# Patient Record
Sex: Male | Born: 1945 | Race: White | Hispanic: No | Marital: Married | Smoking: Never smoker
Health system: Southern US, Community
[De-identification: ages and names within clinical notes are randomized; demographics above are authoritative.]

## PROBLEM LIST (undated history)

## (undated) DIAGNOSIS — E119 Type 2 diabetes mellitus without complications: Secondary | ICD-10-CM

## (undated) DIAGNOSIS — I350 Nonrheumatic aortic (valve) stenosis: Secondary | ICD-10-CM

## (undated) DIAGNOSIS — I10 Essential (primary) hypertension: Secondary | ICD-10-CM

## (undated) HISTORY — PX: OTHER SURGICAL HISTORY: SHX169

## (undated) HISTORY — PX: AORTIC VALVE REPLACEMENT: SHX41

## (undated) HISTORY — PX: THORACIC AORTIC ANEURYSM REPAIR: SHX799

## (undated) HISTORY — DX: Type 2 diabetes mellitus without complications: E11.9

## (undated) HISTORY — DX: Essential (primary) hypertension: I10

## (undated) HISTORY — PX: ROTATOR CUFF REPAIR: SHX139

---
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
## 2000-03-28 ENCOUNTER — Ambulatory Visit (HOSPITAL_COMMUNITY): Admission: RE | Admit: 2000-03-28 | Discharge: 2000-03-28 | Payer: Self-pay | Admitting: *Deleted

## 2000-12-03 DIAGNOSIS — I350 Nonrheumatic aortic (valve) stenosis: Secondary | ICD-10-CM

## 2000-12-03 HISTORY — PX: THORACIC AORTIC ANEURYSM REPAIR: SHX799

## 2000-12-03 HISTORY — PX: AORTIC VALVE REPLACEMENT: SHX41

## 2000-12-03 HISTORY — DX: Nonrheumatic aortic (valve) stenosis: I35.0

## 2001-04-23 ENCOUNTER — Ambulatory Visit (HOSPITAL_COMMUNITY): Admission: RE | Admit: 2001-04-23 | Discharge: 2001-04-23 | Payer: Self-pay | Admitting: Cardiology

## 2001-10-08 ENCOUNTER — Ambulatory Visit (HOSPITAL_COMMUNITY): Admission: RE | Admit: 2001-10-08 | Discharge: 2001-10-08 | Payer: Self-pay | Admitting: *Deleted

## 2001-10-08 ENCOUNTER — Encounter: Payer: Self-pay | Admitting: *Deleted

## 2001-10-20 ENCOUNTER — Inpatient Hospital Stay (HOSPITAL_COMMUNITY): Admission: RE | Admit: 2001-10-20 | Discharge: 2001-10-25 | Payer: Self-pay | Admitting: Surgery

## 2001-10-20 ENCOUNTER — Encounter (INDEPENDENT_AMBULATORY_CARE_PROVIDER_SITE_OTHER): Payer: Self-pay | Admitting: *Deleted

## 2001-10-20 ENCOUNTER — Encounter: Payer: Self-pay | Admitting: Surgery

## 2001-10-21 ENCOUNTER — Encounter: Payer: Self-pay | Admitting: Surgery

## 2001-10-22 ENCOUNTER — Encounter: Payer: Self-pay | Admitting: Surgery

## 2001-11-12 ENCOUNTER — Encounter: Payer: Self-pay | Admitting: Cardiology

## 2001-11-12 ENCOUNTER — Ambulatory Visit (HOSPITAL_COMMUNITY): Admission: RE | Admit: 2001-11-12 | Discharge: 2001-11-12 | Payer: Self-pay | Admitting: Cardiology

## 2002-04-30 ENCOUNTER — Ambulatory Visit (HOSPITAL_COMMUNITY): Admission: RE | Admit: 2002-04-30 | Discharge: 2002-04-30 | Payer: Self-pay | Admitting: Cardiology

## 2004-05-12 ENCOUNTER — Encounter: Payer: Self-pay | Admitting: Cardiology

## 2004-05-12 ENCOUNTER — Ambulatory Visit (HOSPITAL_COMMUNITY): Admission: RE | Admit: 2004-05-12 | Discharge: 2004-05-12 | Payer: Self-pay | Admitting: *Deleted

## 2004-10-04 ENCOUNTER — Ambulatory Visit: Payer: Self-pay | Admitting: Cardiology

## 2004-10-20 ENCOUNTER — Ambulatory Visit: Payer: Self-pay | Admitting: Cardiology

## 2004-11-01 ENCOUNTER — Ambulatory Visit: Payer: Self-pay | Admitting: Cardiology

## 2004-11-29 ENCOUNTER — Ambulatory Visit: Payer: Self-pay | Admitting: Cardiology

## 2004-12-29 ENCOUNTER — Ambulatory Visit: Payer: Self-pay | Admitting: Cardiology

## 2005-02-01 ENCOUNTER — Ambulatory Visit: Payer: Self-pay | Admitting: Cardiology

## 2005-02-07 IMAGING — US US EXTREM LOW VENOUS*L*
1 series · 14 of 23 positions shown · non-contrast
Comparison: none

CLINICAL DATA: Left leg swelling and pain.
 LEFT LOWER EXTREMITY VENOUS IMAGING-DOPPLER
 Gray scale and Doppler ultrasound evaluation of the deep venous system was performed from the level of the common femoral vein to the popliteal vein. Complete venous compressibility is demonstrated. Duplex wave forms show normal directional venous flow, with respiratory phasicity and normal response to augmentation.

 IMPRESSION
 No evidence of deep venous thrombosis.

[Series 1: unknown · 14 of 23 slices shown]
[im 1/23]
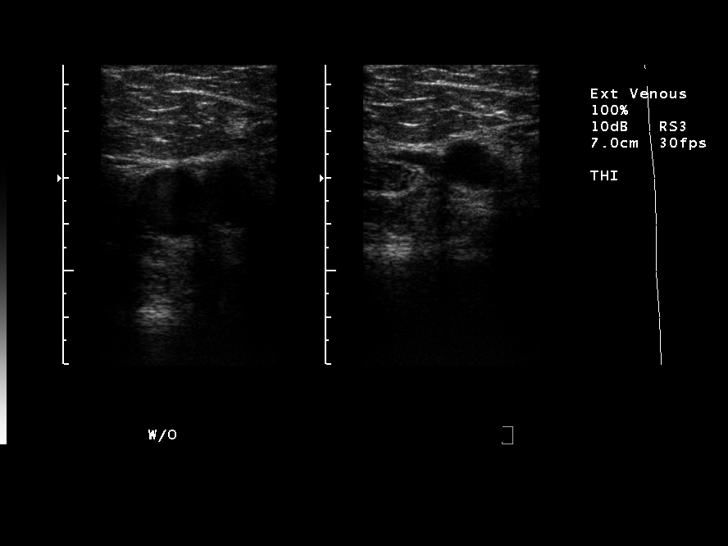
[im 3/23]
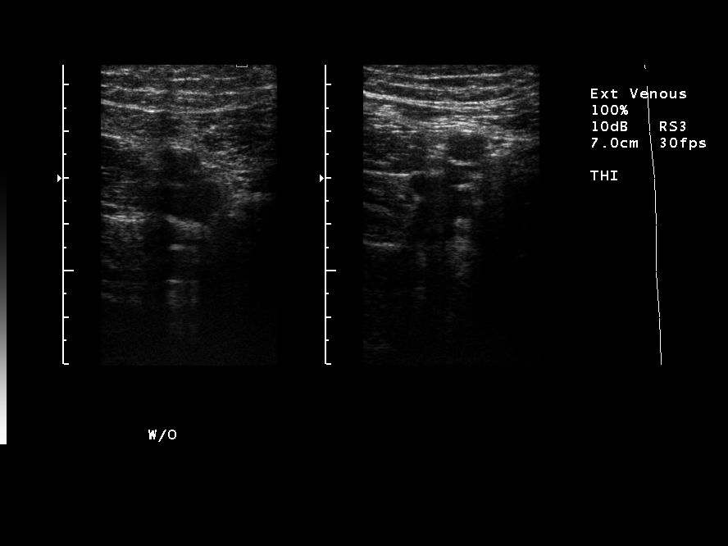
[im 5/23]
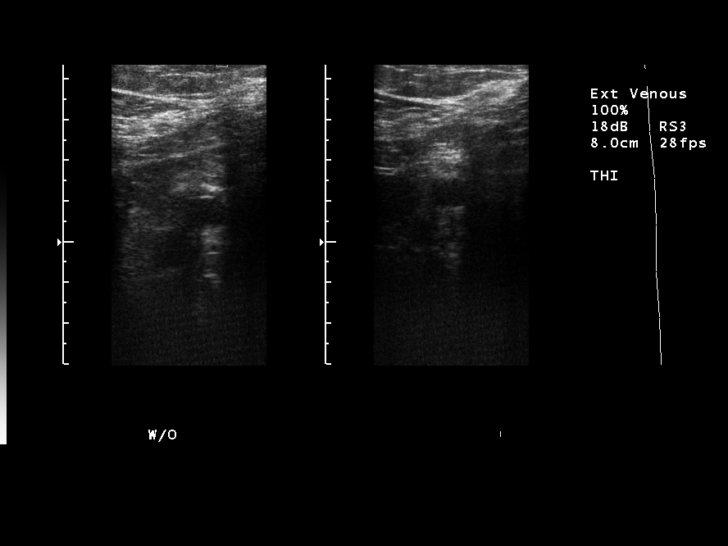
[im 6/23]
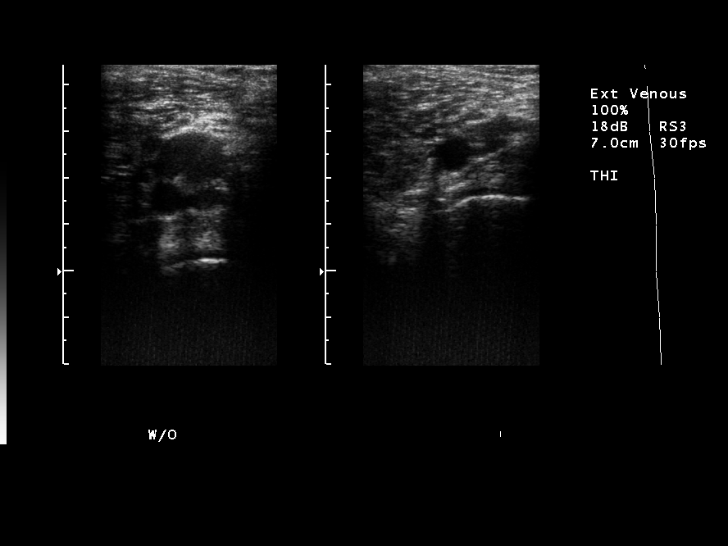
[im 8/23]
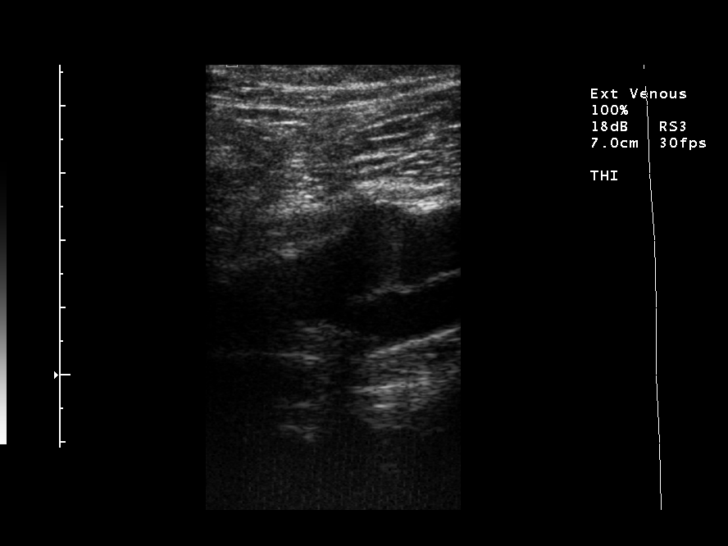
[im 10/23]
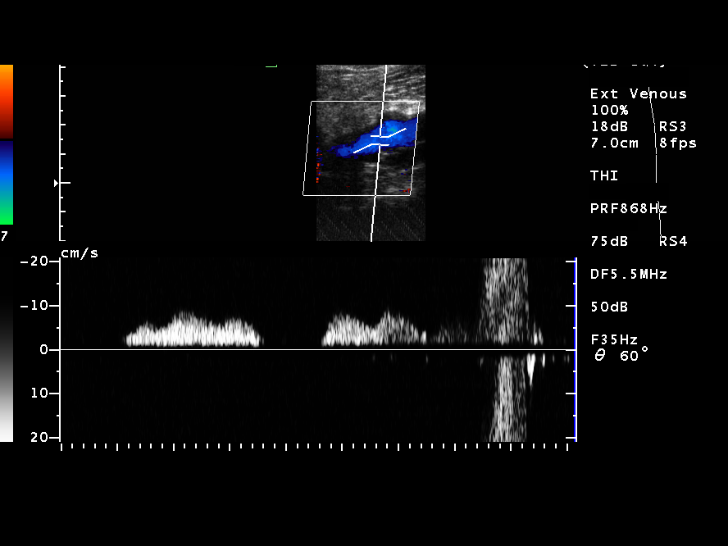
[im 11/23]
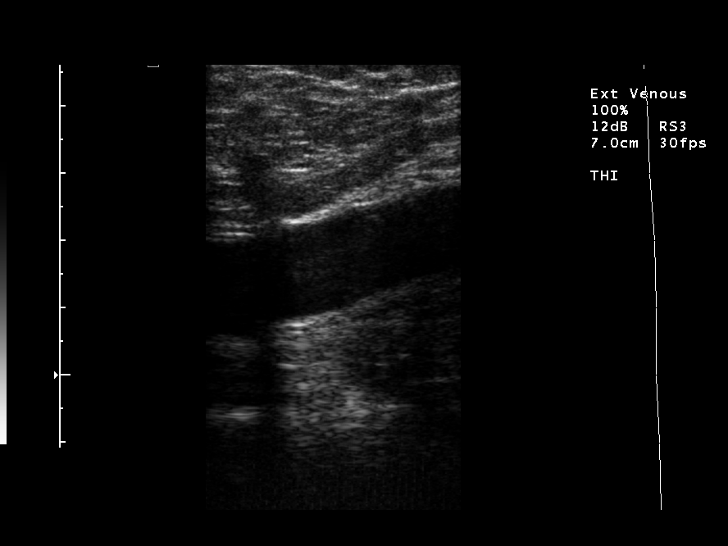
[im 13/23]
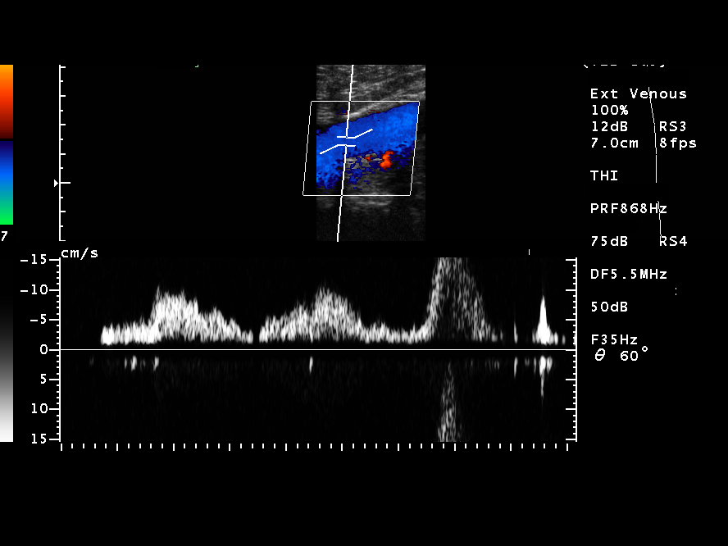
[im 14/23]
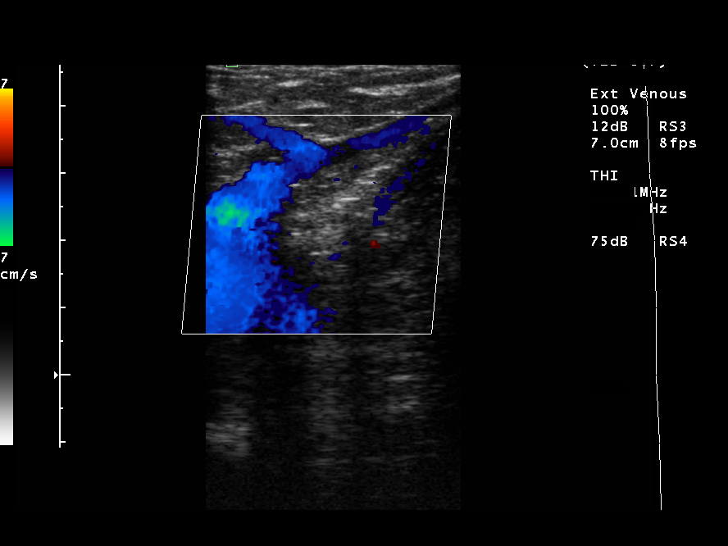
[im 16/23]
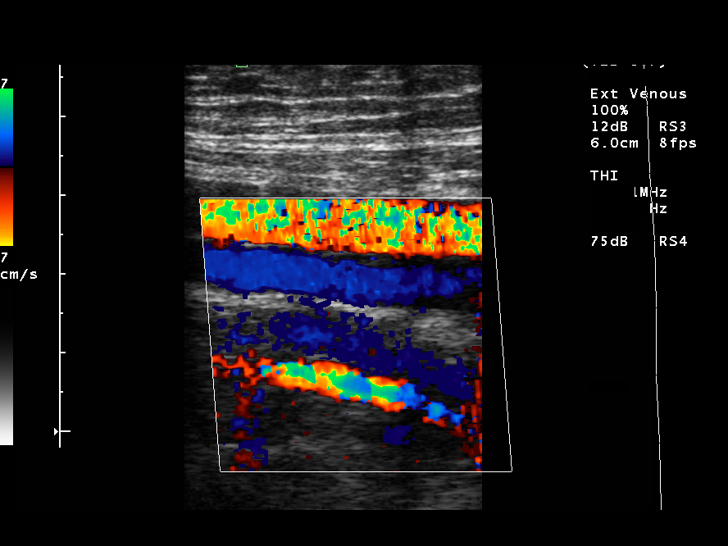
[im 18/23]
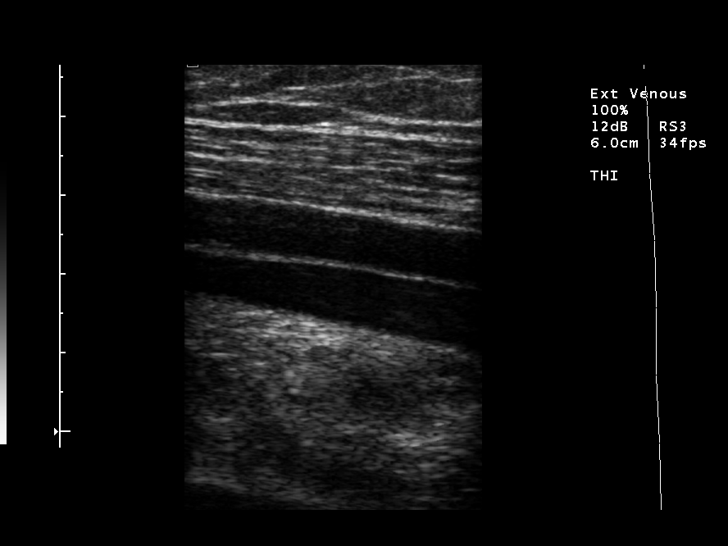
[im 19/23]
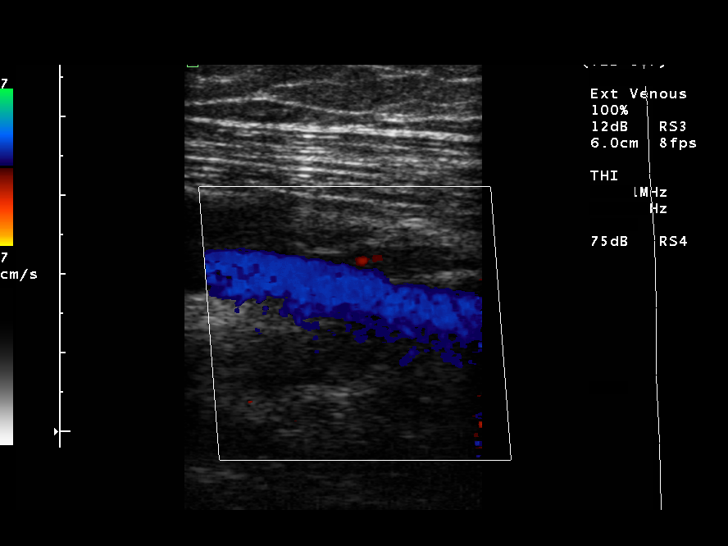
[im 21/23]
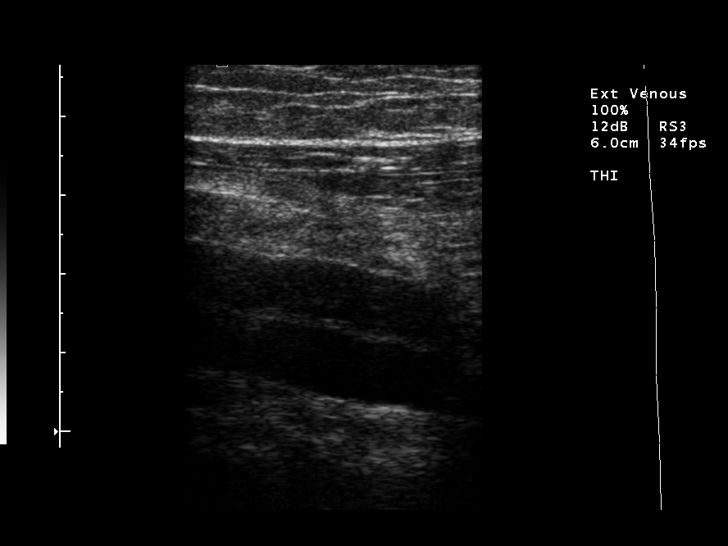
[im 23/23]
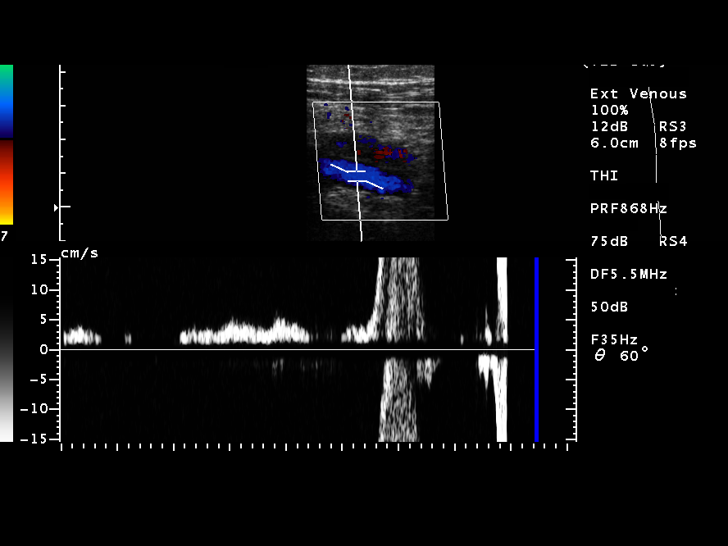

[14 of 23 positions shown; findings below may reference images not displayed]

## 2005-03-05 ENCOUNTER — Ambulatory Visit: Payer: Self-pay | Admitting: Cardiology

## 2005-03-13 ENCOUNTER — Ambulatory Visit: Payer: Self-pay | Admitting: Cardiology

## 2005-04-06 ENCOUNTER — Ambulatory Visit: Payer: Self-pay | Admitting: Cardiology

## 2005-04-13 ENCOUNTER — Ambulatory Visit: Payer: Self-pay | Admitting: Cardiology

## 2005-05-04 ENCOUNTER — Ambulatory Visit: Payer: Self-pay | Admitting: Cardiology

## 2005-05-17 ENCOUNTER — Ambulatory Visit: Payer: Self-pay | Admitting: *Deleted

## 2005-06-13 ENCOUNTER — Ambulatory Visit: Payer: Self-pay | Admitting: Cardiology

## 2005-07-11 ENCOUNTER — Ambulatory Visit: Payer: Self-pay | Admitting: Cardiology

## 2005-08-08 ENCOUNTER — Ambulatory Visit: Payer: Self-pay | Admitting: Cardiology

## 2005-09-05 ENCOUNTER — Ambulatory Visit: Payer: Self-pay | Admitting: Cardiology

## 2005-10-03 ENCOUNTER — Ambulatory Visit: Payer: Self-pay | Admitting: Cardiology

## 2005-11-08 ENCOUNTER — Ambulatory Visit: Payer: Self-pay | Admitting: Cardiology

## 2005-12-06 ENCOUNTER — Ambulatory Visit: Payer: Self-pay | Admitting: Cardiology

## 2006-01-07 ENCOUNTER — Ambulatory Visit: Payer: Self-pay | Admitting: Cardiology

## 2006-02-04 ENCOUNTER — Ambulatory Visit: Payer: Self-pay | Admitting: Cardiology

## 2006-02-25 ENCOUNTER — Ambulatory Visit: Payer: Self-pay | Admitting: Cardiology

## 2006-04-01 ENCOUNTER — Ambulatory Visit: Payer: Self-pay | Admitting: Cardiology

## 2006-04-15 ENCOUNTER — Ambulatory Visit: Payer: Self-pay | Admitting: Cardiology

## 2006-05-02 ENCOUNTER — Ambulatory Visit: Payer: Self-pay | Admitting: Cardiology

## 2006-05-17 ENCOUNTER — Ambulatory Visit: Payer: Self-pay | Admitting: Cardiology

## 2006-05-27 ENCOUNTER — Ambulatory Visit: Payer: Self-pay | Admitting: *Deleted

## 2006-06-20 ENCOUNTER — Ambulatory Visit: Payer: Self-pay | Admitting: Cardiology

## 2006-07-18 ENCOUNTER — Ambulatory Visit: Payer: Self-pay | Admitting: Cardiology

## 2006-09-23 ENCOUNTER — Ambulatory Visit: Payer: Self-pay | Admitting: Cardiology

## 2006-10-22 ENCOUNTER — Ambulatory Visit: Payer: Self-pay | Admitting: Cardiology

## 2006-11-27 ENCOUNTER — Ambulatory Visit: Payer: Self-pay | Admitting: Cardiology

## 2006-12-25 ENCOUNTER — Ambulatory Visit: Payer: Self-pay | Admitting: Cardiology

## 2007-01-03 ENCOUNTER — Ambulatory Visit: Payer: Self-pay | Admitting: Cardiology

## 2007-01-22 ENCOUNTER — Ambulatory Visit: Payer: Self-pay | Admitting: Cardiology

## 2007-02-19 ENCOUNTER — Ambulatory Visit: Payer: Self-pay | Admitting: Cardiology

## 2007-03-19 ENCOUNTER — Ambulatory Visit: Payer: Self-pay | Admitting: Cardiology

## 2007-04-24 ENCOUNTER — Ambulatory Visit: Payer: Self-pay | Admitting: Cardiology

## 2007-05-22 ENCOUNTER — Ambulatory Visit: Payer: Self-pay | Admitting: Cardiology

## 2007-06-23 ENCOUNTER — Ambulatory Visit: Payer: Self-pay | Admitting: Cardiology

## 2007-07-04 ENCOUNTER — Ambulatory Visit: Payer: Self-pay | Admitting: Cardiology

## 2007-07-16 ENCOUNTER — Encounter: Payer: Self-pay | Admitting: Cardiology

## 2007-07-16 ENCOUNTER — Ambulatory Visit: Payer: Self-pay | Admitting: Cardiology

## 2007-07-29 ENCOUNTER — Ambulatory Visit: Payer: Self-pay | Admitting: Cardiology

## 2007-09-09 ENCOUNTER — Ambulatory Visit: Payer: Self-pay | Admitting: Cardiology

## 2007-10-07 ENCOUNTER — Ambulatory Visit: Payer: Self-pay | Admitting: Cardiology

## 2007-11-06 ENCOUNTER — Ambulatory Visit: Payer: Self-pay | Admitting: Cardiology

## 2007-12-08 ENCOUNTER — Ambulatory Visit: Payer: Self-pay | Admitting: Cardiology

## 2007-12-22 ENCOUNTER — Ambulatory Visit: Payer: Self-pay | Admitting: Cardiology

## 2008-01-05 ENCOUNTER — Ambulatory Visit: Payer: Self-pay | Admitting: Cardiology

## 2008-02-09 ENCOUNTER — Ambulatory Visit: Payer: Self-pay | Admitting: Cardiology

## 2008-03-16 ENCOUNTER — Ambulatory Visit: Payer: Self-pay | Admitting: Cardiology

## 2008-04-13 ENCOUNTER — Ambulatory Visit: Payer: Self-pay | Admitting: Cardiology

## 2008-05-12 ENCOUNTER — Ambulatory Visit: Payer: Self-pay | Admitting: Cardiology

## 2008-09-10 ENCOUNTER — Ambulatory Visit: Payer: Self-pay | Admitting: Cardiology

## 2008-09-10 ENCOUNTER — Encounter: Payer: Self-pay | Admitting: Physician Assistant

## 2008-09-23 ENCOUNTER — Encounter: Payer: Self-pay | Admitting: Physician Assistant

## 2009-04-15 ENCOUNTER — Ambulatory Visit: Payer: Self-pay | Admitting: Cardiology

## 2009-05-27 ENCOUNTER — Ambulatory Visit (HOSPITAL_COMMUNITY): Admission: RE | Admit: 2009-05-27 | Discharge: 2009-05-28 | Payer: Self-pay | Admitting: Orthopedic Surgery

## 2009-08-11 DIAGNOSIS — E785 Hyperlipidemia, unspecified: Secondary | ICD-10-CM | POA: Insufficient documentation

## 2009-08-11 DIAGNOSIS — I10 Essential (primary) hypertension: Secondary | ICD-10-CM

## 2010-01-26 ENCOUNTER — Encounter: Payer: Self-pay | Admitting: Cardiology

## 2010-03-02 ENCOUNTER — Encounter: Payer: Self-pay | Admitting: Cardiology

## 2010-04-05 ENCOUNTER — Encounter: Payer: Self-pay | Admitting: Cardiology

## 2010-04-20 ENCOUNTER — Ambulatory Visit: Payer: Self-pay | Admitting: Cardiology

## 2010-04-20 DIAGNOSIS — Z954 Presence of other heart-valve replacement: Secondary | ICD-10-CM | POA: Insufficient documentation

## 2011-01-02 NOTE — Assessment & Plan Note (Signed)
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  The patient had minimal coronary artery disease.  He has a normal ejection fraction.  He has a history of hypertension but this has been controlled.  The patient is compliant with Coumadin therapy.  He denies any shortness of breath orthopnea PND has no palpitations or syncope.  Preventive Screening-Counseling & Management  Alcohol-Tobacco     Smoking Status: never  Current Medications (verified): 1)  Warfarin Sodium 5 Mg Tabs (Warfarin Sodium) .Marland Kitchen.. 1 1/2 Tablet By Mouth As Directed 2)  Lisinopril 20 Mg Tabs (Lisinopril) .... Take 1 Tablet By Mouth Once A Day 3)  Lipitor 20 Mg Tabs (Atorvastatin Calcium) .... Take 1 Tablet By Mouth Once A Day  Allergies (verified): No Known Drug Allergies  Comments:  Nurse/Medical Assistant: The patient is currently on medications but does not know the name or dosage at this time. Instructed to contact our office with details. Will update medication list at that time.  Past History:  Past Medical History: Last updated: 08/11/2009 HYPERTENSION, UNSPECIFIED (ICD-401.9) HYPERLIPIDEMIA-MIXED (ICD-272.4)    Past Surgical History: Last updated: 08/11/2009 Right shoulder surgery Valve Replacement-Aortic (S/P)  Family History: Reviewed history and no changes required. noncontributory  Social History: Reviewed history and no changes required. patient doesn't smokeSmoking Status:  never  Vital Signs:  Patient profile:   65 year old male Height:      69 inches Weight:      214 pounds BMI:     31.72 Pulse rate:   65 / minute BP sitting:   121 / 73  (left arm) Cuff size:   large  Vitals Entered By: Carlye Grippe (Apr 20, 2010 2:29 PM)  Nutrition Counseling:  Patient's BMI is greater than 25 and therefore counseled on weight management options.  Physical Exam  Additional Exam:  General: Well-developed, well-nourished in no distress head: Normocephalic and atraumatic eyes PERRLA/EOMI intact, conjunctiva and lids normal nose: No deformity or lesions mouth normal dentition, normal posterior pharynx neck: Supple, no JVD.  No masses, thyromegaly or abnormal cervical nodes lungs: Normal breath sounds bilaterally without wheezing.  Normal percussion heart: regular rate and rhythm with normal S1 normal closing click of S2. no S3 or S4.  PMI is normal.  No pathological murmurs abdomen: Normal bowel sounds, abdomen is soft and nontender without masses, organomegaly or hernias noted.  No hepatosplenomegaly musculoskeletal: Back normal, normal gait muscle strength and tone normal pulsus: Pulse is normal in all 4 extremities Extremities: No peripheral pitting edema neurologic: Alert and oriented x 3 skin: Intact without lesions or rashes cervical nodes: No significant adenopathy psychologic: Normal affect    EKG  Procedure date:  04/20/2010  Findings:      normal sinus rhythm nonspecific ST-T wave changes.  Heart rate 64 beats/min otherwise normal tracing.  Impression & Recommendations:  Problem # 1:  AORTIC VALVE REPLACEMENT, HX OF (ICD-V43.3) the patient has a normal appearing aortic valve prosthesis on physical examination.  He is asymptomatic.  Problem # 2:  COUMADIN THERAPY (ICD-V58.61) the patient is on Coumadin therapy  Problem # 3:  HYPERTENSION, UNSPECIFIED (ICD-401.9) blood pressure is controlled.  I have made make no medication changes His updated medication list for this problem includes:    Lisinopril 20 Mg  Tabs (Lisinopril) .Marland Kitchen... Take 1 tablet by mouth once a day  Orders: EKG w/ Interpretation (93000)  Patient Instructions: 1)  Your physician recommends that you continue on your current medications as directed. Please refer  to the Current Medication list given to you today. 2)  Your physician wants you to follow-up in: 1 year. You will receive a reminder letter in the mail about two months in advance. If you don't receive a letter, please call our office to schedule the follow-up appointment.

## 2011-03-12 LAB — PROTIME-INR
INR: 1.2 (ref 0.00–1.49)
Prothrombin Time: 14.3 seconds (ref 11.6–15.2)
Prothrombin Time: 15.6 seconds — ABNORMAL HIGH (ref 11.6–15.2)

## 2011-03-12 LAB — BASIC METABOLIC PANEL
BUN: 11 mg/dL (ref 6–23)
BUN: 12 mg/dL (ref 6–23)
CO2: 27 mEq/L (ref 19–32)
Calcium: 10 mg/dL (ref 8.4–10.5)
Calcium: 8.8 mg/dL (ref 8.4–10.5)
Creatinine, Ser: 0.94 mg/dL (ref 0.4–1.5)
GFR calc non Af Amer: >60 mL/min (ref >60)
GFR calc non Af Amer: >60 mL/min (ref >60)
Glucose, Bld: 101 mg/dL — ABNORMAL HIGH (ref 70–99)
Glucose, Bld: 137 mg/dL — ABNORMAL HIGH (ref 70–99)
Sodium: 137 mEq/L (ref 135–145)

## 2011-03-12 LAB — APTT: aPTT: 30 seconds (ref 24–37)

## 2011-03-12 LAB — CBC
Hemoglobin: 12.7 g/dL — ABNORMAL LOW (ref 13.0–17.0)
MCV: 86.5 fL (ref 78.0–100.0)
Platelets: 215 10*3/uL (ref 150–400)
RDW: 13 % (ref 11.5–15.5)

## 2011-03-12 LAB — GLUCOSE, CAPILLARY
Glucose-Capillary: 113 mg/dL — ABNORMAL HIGH (ref 70–99)
Glucose-Capillary: 128 mg/dL — ABNORMAL HIGH (ref 70–99)

## 2011-03-19 ENCOUNTER — Telehealth: Payer: Self-pay | Admitting: Cardiology

## 2011-03-19 NOTE — Telephone Encounter (Signed)
Coumadin does not need to be stopped before dental extraction. The patient has a mechanical valve. Risk of valve thrombosis outweighs risk of bleeding with dental extraction. However the patient's PT INR will need to be measured within 24 hours before dental extraction. INR needs to be less than 3. The patient may bleed more than normal but wheezing should be easily treated with local measures.Patient will need close followup with his dentist and if bleeding post dental extraction and he needs to see his dentist back in he needs a repeat PT/INR.

## 2011-03-20 ENCOUNTER — Encounter: Payer: Self-pay | Admitting: *Deleted

## 2011-03-20 NOTE — Telephone Encounter (Addendum)
Wife notified of below.  Requested we send two copies of this info to her in the mail.  She would like to have copy for husband and one take to the dentist.  Will mail to home address as requested.

## 2011-03-20 NOTE — Telephone Encounter (Signed)
Coumadin does not need to be stopped before dental extraction. The patient has a mechanical valve. Risk of valve thrombosis outweighs risk of bleeding with dental extraction. However the patient's PT INR will need to be measured within 24 hours before dental extraction. INR needs to be less than 3. The patient may bleed more than normal but oozing should be easily treated with local measures.Patient will need close followup with his dentist and if bleeding post dental extraction then he needs to see his dentist back in he needs a repeat PT/INR immediately.

## 2011-03-20 NOTE — Telephone Encounter (Signed)
Please clarify end of comment.  May need to fax to his dentist.

## 2011-04-17 NOTE — Assessment & Plan Note (Signed)
[facility]                          Jared Rodriguez   CLEARANCE, CHENAULT                    MRN:          [id number]  DATE:09/10/2008                            DOB:          [birth year]    CARDIOLOGIST:  Jared Codding, MD, [facility]   PRIMARY CARE PHYSICIAN:  Jared Lora, MD   REASON FOR VISIT:  One-year followup.   HISTORY OF PRESENT ILLNESS:  [name] is a 65 year old male patient  with a history of bicuspid aortic valve with severe aortic stenosis  status post aortic valve replacement with a St. Jude mechanical  prosthesis by Dr. Laneta Simmers in 2002.  At the time of his cardiac  catheterization, he had minimal nonobstructive coronary artery disease.  He was last seen in July 2008 by Dr. Andee Lineman.  At that time, he was set  up for stress Cardiolite testing.  This revealed no ischemia.  His EF  was 52%.  His echocardiogram revealed normal functioning aortic valve  prosthesis and good LV function with an EF of 50-55%.  He also has a  history of hypertension and hyperlipidemia.  His Coumadin is managed by  his primary care physician.  The patient denies any recent history of  chest pain or shortness of breath.  He denies orthopnea, PND or pedal  edema.  He denies any syncope.  Overall, he has been doing well without  any significant problems.   CURRENT MEDICATIONS:  1. Lisinopril 10 mg daily.  2. Lipitor 20 mg daily.  3. Warfarin as directed by Dr. Margo Common.   PHYSICAL EXAMINATION:  GENERAL:  He is a well-nourished, well-developed  male, in no distress.  VITAL SIGNS:  Blood pressure is 145/80, pulse 65, weight 219.6 pounds.  HEENT:  Normal.  NECK:  Without JVD.  CARDIAC:  Normal S1, mechanical S2.  Regular rate and rhythm, short 1/6  systolic ejection murmur best heard at the left sternal border.  LUNGS:  Clear to auscultation bilaterally.  ABDOMEN:  Soft and nontender.  EXTREMITIES:  Without edema.  NEUROLOGIC:  He is alert and  oriented x3.  Cranial nerves II-XII are  grossly intact.  VASCULAR:  No carotid bruits noted bilaterally.   Electrocardiogram reveals sinus rhythm, heart rate of 56, normal axis,  no acute changes.   ASSESSMENT AND PLAN:  1. Status post St. Jude mechanical aortic valve replacement secondary      to bicuspid aortic valve with severe aortic stenosis in November      2002, by Dr. Laneta Simmers.  He is overall stable and had recent      echocardiography in August 2008.  No further workup is warranted at      this time.  His Coumadin is followed by Dr. Margo Common.  2. Minimal nonobstructive coronary artery disease by catheterization      in 2002.  His last stress test was last year and it was      nonischemic.  He is having no symptoms of angina.  No further      workup is warranted at this time.  3. Hypertension.  This is somewhat uncontrolled.  I have gone through      several of his notes over the last year and he is always above      140/90.  I have recommended initiating HCTZ at 12.5 mg a day.      However, before we do this, I want to check a BMET to ensure that      he has normal renal function.  As long as his BMET looks normal, we      will start him on the HCTZ with a followup BMET in 1 week's time.  4. Hyperlipidemia.  He will continue follow up with Dr. Margo Common   Disposition.  The patient will follow up in 1 year or sooner p.r.n.      Tereso Newcomer, PA-C  Electronically Signed      Jared Codding, MD,FACC  Electronically Signed   SW/MedQ  DD: 09/10/2008  DT: 09/11/2008  Job #: [id number]

## 2011-04-17 NOTE — Op Note (Signed)
NAME:  Jared Rodriguez, Jared Rodriguez NO.:  [id number]   MEDICAL RECORD NO.:  [id number]          PATIENT TYPE:  OIB   LOCATION:  4706                         FACILITY:  [facility]   PHYSICIAN:  Dyke Brackett, M.D.    DATE OF BIRTH:  Oct 30, 1946   DATE OF PROCEDURE:  DATE OF DISCHARGE:                               OPERATIVE REPORT   INDICATIONS:  This is a 65 year old male with severe rotator cuff tear,  requiring cardiology clearance, right shoulder, thought to be amenable  to overnight hospitalization for monitoring.   PREOPERATIVE DIAGNOSES:  1. Severe rotator cuff tear with complete tearing of infraspinatus and      supraspinatus.  2. Degenerate tearing of the anterior superior labrum.  3. Impingement.  4. Acromioclavicular joint arthritis.   POSTOPERATIVE DIAGNOSES:  1. Severe rotator cuff tear with complete tearing of infraspinatus and      supraspinatus.  2. Degenerate tearing of the anterior superior labrum.  3. Impingement.  4. Acromioclavicular joint arthritis.   OPERATION:  1. Open rotator cuff repair and acromioplasty.  2. Arthroscopic debridement, torn labrum.  3. Open distal clavicle excision.   SURGEON:  Dyke Brackett, MD   ASSISTANT:  Sharol Given, PA.   BLOOD LOSS:  Minimal blood loss.   DESCRIPTION OF PROCEDURE:  After nerve block as well as general  anesthesia, right arm was sterilely prepped and draped.  Arthroscopy  portals created after the patient was placed in the beach-chair position  posteriorly, laterally, and anteriorly.  Systematic inspection of the  shoulder showed the patient to have an extremely large retracted cuff  tear about 2 cm from being at the level with the glenoid.  Complete  tearing of the infraspinatus and supraspinatus was noted with sparing  subscapularis.  Biceps tendon intact, but degenerative tearing anterior  of the superior labrum was debrided arthroscopically separately from the  rest of the procedure.  We dealt with  the large cuff tear.  Once this  was accomplished, an the incision was made dissecting the [facility] acromial  interval.  Severe degenerative arthritis was resected with distal  clavicle excision.  Severe hypertrophic acromion was resected about 7-8  mm anteriorly at the acromion.  Extreme amount of bursal hypertrophy and  inflammation was noted with complete bursectomy carried out.  Identification of a very large retracted cuff tear.  Extensive  mobilization of the cuff tear was required.  Initially, it was thought  this might not be repairable, but after extensive mobilization it could  be brought back to nearly anatomic attachment site on the tuberosity.  Tuberosity was repaired with bur back to bleeding bone and then 3  Arthrex anchors were placed for a total of 6 sutures from the front to  the back to repair the tendon.  Edges of the tendon were oversewn with  Tycron.  Nearly watertight repair was obtained under slight to  mild tension, but with the arm abducted mildly there was no tension on  the repair.  Wound was irrigated and closed on the split deltoid with  interrupted Tycron and then 0 Vicryl and staples.  He was placed in a  lightly compressive sterile dressing and a pillow splint.      Dyke Brackett, M.D.  Electronically Signed     Dyke Brackett, M.D.  Electronically Signed    WDC/MEDQ  D:  05/27/2009  T:  05/27/2009  Job:  [id number]

## 2011-04-17 NOTE — Assessment & Plan Note (Signed)
[facility]                          [facility]   Jared, Rodriguez                    MRN:          [id number]  DATE:06/23/2007                            DOB:          06-17-1946    HISTORY OF PRESENT ILLNESS:  The patient is a 65 year old male  previously seen by Dr. Daleen Squibb and Dr. Dorethea Clan at the [facility].  The patient has history of bicuspid aortic valve with severe aortic  stenosis.  Patient is status post aortic valve replacement with a 25 mm  St. Jude mechanical prosthesis and replacement of an ascending aortic  aneurysm using a 20 mm Hemashield graft.  The patient states he has been  doing well.  He reports no chest pain, shortness of breath, orthopnea,  PND.  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  He denies any syncope or presyncope.   MEDICATIONS:  1. Lisinopril 20 mg p.o. daily.  2. Lipitor 0 mg p.o. nightly.  3. Warfarin 7.5 mg as directed.   PHYSICAL EXAMINATION:  VITAL SIGNS:  Blood pressure 141/80 mmHg, heart  rate 69 beats per minute, weight is 215 pounds.  NECK:  Normal carotid upstroke, no carotid bruits.  LUNGS:  Clear breath sounds bilaterally.  HEART:  Regular rate and rhythm, normal S1-S2 with normal closing click  consistent with normal mechanical valve function.  No pathological  murmurs.  ABDOMEN:  Soft, nontender.  No rebound or guarding.  EXTREMITY:  No cyanosis, clubbing, or edema.   Echocardiogram normal sinus rhythm, occasional PVCs.   PROBLEM LIST:  1. History of bicuspid aortic valve.  2. Status post 25 mm St. Jude mechanical valve prosthesis replacement.  3. History of ascending aortic aneurysm using a 20 mm Hemashield graft      repair.  4. Coumadin anticoagulation.  5. Palpitations.  6. 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   PLAN:  1. The patient states that he wants to re-establish with our office.      He does not  want to go to the [place] office anymore.  2. INR was checked today, it was 2.9.  The patient can remain on his      current medical regimen of Coumadin.  3. 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  It has now been 5 years since his last      echocardiographic study.  4. The patient will follow up with [place] in one year.     Learta Codding, MD,FACC  Electronically Signed    GED/MedQ  DD: 06/23/2007  DT: 06/23/2007  Job #: [id number]   cc:   Wyvonnia Lora

## 2011-04-17 NOTE — Assessment & Plan Note (Signed)
[facility]                          [facility]   Jared Rodriguez, Jared Rodriguez                    MRN:          [id number]  DATE:04/15/2009                            DOB:          [birth year]    REFERRING PHYSICIAN:  Helayne Seminole, PA   PRIMARY CARE PHYSICIAN:  Dr. Wyvonnia Lora.   REASON FOR PRESENTATION:  Preoperative clearance in the patient with  aortic valve replacement.   HISTORY OF PRESENT ILLNESS:  The patient is now 65 years old.  He is  status post aortic valve replacement in 2002.  He is due to have  shoulder surgery.  He has mechanical aortic valve and has been  chronically on Coumadin.  He has had no problems since he had this valve  and root replacement.  A couple of years ago in 2008, he did have a  stress perfusion study which demonstrated no evidence of ischemia or  infarct and well-preserved ejection fraction.  He also had an  echocardiogram demonstrating a well seated mechanical aortic valve with  an EF of 50-55% and some mild concentric hypertrophy.   Since that time, the patient has had no problems.  He has had no chest  discomfort, neck, or arm discomfort.  He does remain active, though he  is not exercising as routinely as I would like.  With his levels of  activity (greater than 5 METS), he does not describe any chest pressure,  neck, or arm discomfort.  He has no palpitations, presyncope, or  syncope.  He has no PND or orthopnea.   PAST MEDICAL HISTORY:  Hypertension x25 years, hyperlipidemia x15 years,  aortic root dilatation status post replacement.   PAST SURGICAL HISTORY:  Right shoulder surgery, aortic valve replacement  with replacement of the ascending aorta with a 25-mm St. Jude mechanical  valve, October 20, 2001.   ALLERGIES:  None.   MEDICATIONS:  1. Lisinopril 20 mg daily.  2. Lipitor 20 mg daily.  3. Coumadin 7.5 mg daily.   REVIEW OF SYSTEMS:  As stated in the HPI and otherwise negative  for all  other systems.   PHYSICAL EXAMINATION:  GENERAL:  The patient is pleasant and in no  distress.  VITAL SIGNS:  Blood pressure 130/79, heart rate 58 and regular, weight  208 pounds.  HEENT:  Eyelids are unremarkable, pupils equal, round, and reactive to  light, fundi not visualized, oral mucosa unremarkable.  NECK:  No jugular venous distention at 45 degrees, carotid upstroke  brisk and symmetric, no bruits, no thyromegaly.  LYMPHATICS:  No cervical, axillary, or inguinal adenopathy.  LUNGS:  Clear to auscultation bilaterally.  CHEST:  A well-healed sternotomy scar.  HEART:  PMI not displaced or sustained, S1 within normal limits,  mechanical S2, 2/6 apical systolic murmur radiating slightly out the  aortic outflow tract, no diastolic murmur.  ABDOMEN:  Mildly obese, positive bowel sounds.  Normal in frequency and  pitch.  No bruits, rebound, guarding, or midline pulsatile mass.  No  hepatomegaly, no splenomegaly.  SKIN:  No rashes.  No nodules.  EXTREMITIES:  Pulses 2+ throughout, no  edema, no cyanosis or clubbing.  NEURO:  Oriented to person, place and time, cranial nerves II-XII  grossly intact, motor grossly intact.   EKG; sinus bradycardia, rate 54, axis within normal limits, intervals  within normal limits, no acute ST-T wave changes.   IMPRESSION:  1. Preoperative clearance.  The patient is at acceptable risk for the      planned surgery without further cardiovascular testing.  He      certainly will need to be off his Coumadin.  I would suggest      bridging Lovenox which we can arrange prior to surgery.  I will      need to speak with the surgeon to see when he would be able to be      fully anticoagulated after surgery.  If it can be immediately, then      we would start Lovenox bridge again to Coumadin.  If he would      prefer that we wait few days, I would just use the Coumadin alone.      This is a St. Jude's in an aortic position, which is a low       thromboembolic risk.  I had long discussion with the patient about      this.  2. Aortic valve replacement.  This seems to be very stable by physical      exam.  He is having no symptoms.  He is going to have a yearly      followup in October, but we will change this to May with Dr.      Andee Lineman.  He understands endocarditis prophylaxis.  He will continue      to follow in the [facility].  3. Hypertension.  Blood pressure is well controlled and he will      continue on the medications as listed.  4. Dyslipidemia, per Dr. Margo Common.  5. Followup as above.     Rollene Rotunda, MD, [facility]  Electronically Signed    JH/MedQ  DD: 04/15/2009  DT: 04/16/2009  Job #: [id number]   cc:   Peyton Bottoms, PA

## 2011-04-20 NOTE — Procedures (Signed)
[facility]  Patient:    Jared Rodriguez, Jared Rodriguez Visit Number: [id number] MRN: [id number]          Service Type: SUR Location: 2300 [id number] Attending Physician:  Cleatrice Burke Dictated by:   Judie Petit, M.D. Admit Date:  10/20/2001                             Procedure Report  INDICATIONS FOR THE PROCEDURE:  Jared Rodriguez is a 65 year old male who presents today for aortic valve replacement by Alleen Borne, M.D.  The patient is known to have aortic stenosis, as well as a dilated aortic root. We plan to placed transesophageal echocardiogram for evaluation of cardiac structures and function pre and post valvular replacement.  DESCRIPTION OF PROCEDURE:  The patient was brought to the holding area the morning of surgery and radial and pulmonary artery lines were placed under local anesthesia with sedation.  The patient was then taken to the operating room for routine induction of anesthesia.  The trachea was intubated.  A transesophageal probe was lubricated and passed oropharyngeally into the stomach and then withdrawn for imaging of the heart.  Precardiopulmonary bypass:  Left ventricle:  Overall there was good left ventricular function.  The papillary muscles were well outlined.  No masses were noted within the left ventricular chamber.  Aortic valve:  The aortic valve appeared bileaflet in nature.  There was significant aortic stenosis with mild to moderate aortic insufficiency noted by Doppler examination.  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  Mitral valve:  The mitral valve appeared to function appropriately.  The valve closed appropriately during systolic ejection.  On Doppler examination, there was no significant regurgitant flow noted.  Left atrium:  The appendage was able to be visualized and there were no masses noted.  The atrial septum was intact.  Right ventricle:  The right  ventricle appeared grossly normal.  No significant abnormalities noted.  Right atrium:  The right atrium appeared normal.  The patient was placed on cardiopulmonary bypass and the diseased aortic valve was excised.  A #25 St. Jude mechanical valve was carried out, as well as grafting of the ascending aorta.  Deairing maneuvers were carried out.  The patient as subsequently rewarmed and separated from cardiopulmonary bypass with the initial attempt.  Post cardiopulmonary bypass transesophageal echocardiogram limited examination.  Left ventricle in the early bypass.  The left ventricular chamber revealed low volume, however, with adequate replacement of iron the contractile pattern significant improved.  Left ventricular function was normal.  Aortic valve:  The valve appeared to be seated well.  There did not appear to be any obstructions to flow during injection.  There were no other significant abnormalities.  The rest of the cardiac examination was as previously described.  The patient was subsequently taken to the cardiac intensive care unit in stable condition. Dictated by:   Judie Petit, M.D. Attending Physician:  Cleatrice Burke DD:  10/20/01 TD:  10/21/01 Job: [id number]

## 2011-04-20 NOTE — Discharge Summary (Signed)
. [facility]  Patient:    Jared Rodriguez, Jared Rodriguez Visit Number: [id number] MRN: [id number]          Service Type: SUR Location: 2000 2005 01 Attending Physician:  Cleatrice Burke Dictated by:   Loura Pardon, P.A.-C. Admit Date:  10/20/2001 Discharge Date: 10/25/2001   CC:         Jared Rodriguez, M.D.  Jared Rodriguez, M.D.  Jared Rodriguez, M.D. [facility]   Discharge Summary  DATE OF BIRTH:  [birth year]  DISCHARGE DIAGNOSES: 1. Congenital bicuspid aortic valve. 2. Severe aortic stenosis. 3. Dilation of the aortic root.  SECONDARY DIAGNOSES: 1. Hyperlipidemia. 2. Hypertension.  PROCEDURE:  October 20, 2001, aortic valve replacement with placement of the ascending aorta.  The valve is a 25 mm St. Jude mechanical valve.  The surgeon, Jared Rodriguez, M.D.  The patient tolerated the procedure well and was transferred in stable satisfactory condition to the recovery room.  His postoperative convalescence has lasted five days.  DISCHARGE DISPOSITION:  Jared Rodriguez went home October 25, 2001 postoperative day #5.  At that time, his INR was 2.0 at the lower end of the therapeutic range for a St. Jude mechanical valve.  He will have his Coumadin checked at [facility], and the results of that will be sent to the [facility] for evaluation.  Jared Rodriguez has done very well in the postoperative period.  He has no experienced any cardiac dysrhythmias or respiratory compromise.  He has been taken off all oxygen by postoperative day #3 and at that time, he was also ambulating independently without desaturation.  He did have some mild nausea in the immediate postoperative period, and his analgesic was changed from Percocet to Ultram.  He was extubated on the day of surgery.  His Coumadin was started on postoperative day #1, became therapeutic on postoperative day #5.  His dose in the postoperative period was 7.5 mg daily.  He  has been afebrile in the postoperative period.  His incision was healing nicely.  His mental status has remained clear.  DISCHARGE MEDICATIONS: 1. Ultram 50 mg 1-2 tabs p.o. q.4-6h. p.r.n. pain. 2. Coumadin 5 mg.  He is to take 1-1/2 tabs or 7.5 mg Saturday, November 23; 1    tab or 5.0 mg Sunday, November 24, and to report to [facility],    [facility] on Monday, November 25.  He will get a PT/INR study. 3. Lipitor 20 mg preferably at bedtime. 4. Lopressor 50 mg 1/2 tab every 8 hours. 5. Senokot 2 tabs at bedtime. 6. Colace 100 mg 2 tabs at bedtime.  DISCHARGE ACTIVITY:  Ambulation to build up his strength.  He is asked not to lift more than 10 pounds for six weeks nor is he to drive until he sees Dr. Laneta Rodriguez.  DISCHARGE DIET:  Low sodium, low cholesterol.  WOUND CARE:  He may shower daily, keeping his incision clean and dry.  He will follow-up as mentioned above, at the Coumadin Clinic, [facility] in [place], Monday, October 27, 2001, for blood test.  He is to see Dr. Daleen Rodriguez two weeks after his discharge.  He is asked to call [phone] to arrange the appointment.  Chest x-ray will be taken at that time, and he will see Dr. Laneta Rodriguez three weeks after discharge.  Dr. Garen Rodriguez office will call to arrange that appointment.  BRIEF HISTORY:  Jared Rodriguez is a 65 year old gentleman.  He has been  followed since April 2001, for aortic stenosis.  He has a congenital bicuspid aortic valve.  His last echocardiogram was in May 2002.  This study demonstrated an aortic valve area of 0.8 cm with a mean gradient of 63 mmHg. His left ventricular ejection fraction was 60%.  He did have aortic root dilatation of about 4.5 cm.  For the past few months prior to this admission, he has worsening dyspnea on exertion with some chest tightness, especially with exertion.  He does describe this as mild, and it resolves with rest.  He has not had any periods of syncope.  He was admitted to  [facility] on October 20, 2001, and, as mentioned above, his postoperative convalescence has been without complication. Dictated by:   Loura Pardon, P.A.-C. Attending Physician:  Cleatrice Burke DD:  10/25/01 TD:  10/27/01 Job: [id number]

## 2011-04-20 NOTE — Cardiovascular Report (Signed)
[place]. [facility]  Patient:    Jared Rodriguez, Jared Rodriguez Visit Number: [id number] MRN: [id number]          Service Type: CAT Location: [facility] 2899 11 Attending Physician:  Daisey Must Dictated by:   Daisey Must, M.D. [facility] Proc. Date: 10/08/01 Admit Date:  10/08/2001 Discharge Date: 10/08/2001   CC:         Wyvonnia Lora, M.D.  Thomas C. Wall, M.D. [facility]  Cardiac Catheterization Laboratory   Cardiac Catheterization  PROCEDURES PERFORMED: Right and left heart catheterization with coronary angiography and aortic root angiography.  INDICATIONS: The patient is a 65 year old male with a history of aortic stenosis secondary to bicuspid aortic valve. He has recently developed symptoms of exertional dyspnea and chest tightness. Echocardiogram showed progression of his aortic stenosis with a mean gradient of 63 mmHg. Based on his severe symptomatic aortic stenosis he was referred for cardiac catheterization in anticipation of aortic valve replacement.  DESCRIPTION OF PROCEDURE: An 8 French sheath was placed in the right femoral vein, 7 French sheath in the right femoral artery.  Right heart catheterization was performed with a standard Swan-Ganz catheter. Coronary angiography was performed with a 6 [place], 6 [place] JR4. Aortic root angiography was performed with a 6 French angled pigtail catheter. We could not cross the aortic valve due to the severity of the stenosis and the orientation of the valve. Therefore, left ventriculography was not performed. There were no complications.  RESULTS:  HEMODYNAMICS: Right atrial mean pressure 6, right ventricular pressure 28/6, pulmonary artery pressure 24/16, pulmonary capillary wedge mean pressure 11. Aortic pressure is 100/66. Cardiac output by the thermodilution method is 5.0, cardiac index 2.3. Cardiac output by the Fick method is 3.9, cardiac index 1.8.  Left ventriculography was not performed as described  above.  Aortic root angiography reveals generalized dilatation of the ascending aorta with a bicuspid aortic valve with what appears to be doming of the leaflets. There is 1+ mild aortic insufficiency.  CORONARY ARTERIOGRAPHY: (Codominant).  Left main is normal.  Left anterior descending artery has a 20% stenosis at its origin. It gives rise to two small diagonal branches.  The left circumflex is a large codominant vessel. There are minor luminal irregularities in the mid circumflex. The circumflex gives rise to three small obtuse marginal branches, a small first posterolateral branch, large second posterolateral branch and a small third posterolateral branch.  Right coronary artery is a codominant vessel. There is a tubular 20% stenosis at its origin and a less than 20% stenosis in the proximal to mid vessel. The distal right coronary artery gives rise to a normal sized posterior descending artery.  IMPRESSIONS: 1. Normal right heart pressures. 2. Aortic dilatation with mild aortic insufficiency and severe aortic stenosis    by echocardiography. 3. No significant coronary artery disease identified.  PLAN: The patient will be referred for aortic valve replacement. Dictated by:   Daisey Must, M.D. [facility] Attending Physician:  Daisey Must DD:  10/08/01 TD:  10/09/01 Job: [id number]

## 2011-04-20 NOTE — Op Note (Signed)
[place]. [facility]  Patient:    Jared Rodriguez, Jared Rodriguez Visit Number: [id number] MRN: [id number]          Service Type: SUR Location: 2300 2304 01 Attending Physician:  Cleatrice Burke Dictated by:   Alleen Borne, M.D. Proc. Date: 10/20/01 Admit Date:  10/20/2001   CC:         CVTS office  Maisie Fus C. Wall, M.D.  Cardiac Cath Lab   Operative Report  PREOPERATIVE DIAGNOSES: 1. Bicuspid aortic valve with severe aortic stenosis. 2. Post-stenotic ascending aortic aneurysm.  POSTOPERATIVE DIAGNOSES: 1. Bicuspid aortic valve with severe aortic stenosis. 2. Post-stenotic ascending aortic aneurysm.  PROCEDURE:  Median sternotomy, extracorporeal circulation, aortic valve replacement using a 25 mm St. Jude mechanical valve; and replacement of the ascending aortic aneurysm using a 20 mm Hemashield graft.  SURGEON:  Alleen Borne, M.D.  ASSISTANT: 1. Mikey Bussing, M.D. 2. Loura Pardon, P.A.  ANESTHESIA:  General endotracheal.  HISTORY OF PRESENT ILLNESS:  The patient is a 65 year old gentleman who has been followed since April 2001, for aortic stenosis with a congenital bicuspid aortic valve.  Cardiac catheterization at that time showed no significant coronary disease with 1+ aortic insufficiency and moderate aortic stenosis. His last echocardiogram was in May 2002, and showed an aortic valve area of 0.8 sq/cm, with a mean gradient of 63 mmHg.  Left ventricular ejection fraction was 60%.  His aortic root was dilated at 4.5 to 4.8 cm.  Aortic valve replacement surgery was recommended at that time, but the patient decided to defer the surgery because he wanted to continue working over the summer.  Over the last few months he has had worsening dyspnea on exertion with some chest tightness.  He underwent repeat cardiac catheterization on October 08, 2001, which showed dilatation of the ascending aorta.  The aortic valve could not be crossed.  The  right sided heart pressures were normal.  There was insignificant coronary disease with possibly 20% left anterior descending artery stenosis at its origin, and about 20% right coronary stenosis.  After review of the echocardiogram and his history and examination of the patient, it was felt that aortic valve replacement was indicated to prevent complications of aortic stenosis, including death.  I was also concerned about the size of his ascending aorta, and a CT scan of the chest was obtained to evaluate his ascending aorta.  This showed that the maximum dimension was about 5 cm at the level of the sonatibular junction.  The aorta came back down towards normal size at around the level of the anominate artery.  The descending aorta was normal size at 2.5 cm.  I felt that replacement of his ascending aorta would be indicated to prevent further enlargement of this aneurysm segment.  I discussed the operative procedure of aortic valve replacement and ascending aortic replacement with the patient, including use of a mechanical valve since he is only 65 years old.  We discussed the need for lifelong anticoagulation, and he had no contraindication to anticoagulation.  I discussed alternatives to surgery, benefits, and risks, including bleeding, possible blood transfusion, infection, stroke, myocardial infarction, and death.  He understood and agreed to proceed.  DESCRIPTION OF PROCEDURE:  The patient was taken to the operating room and placed on the table in the supine position.  After induction of general endotracheal anesthesia, a Foley catheter was placed in the bladder using sterile technique.  Then the chest, abdomen, and both lower extremities  were prepped and draped in the usual sterile manner.  The chest was entered through a median sternotomy incision, and the pericardium was opened in the midline. Examination of the heart showed good ventricular contractility.  The ascending aorta was  enlarged and measured about 5 cm at the level of the sinotibular junction.  There were no palpable plaques in the ascending aorta.  Then a transesophageal echocardiogram was performed by anesthesiology.  This showed severe calcific aortic stenosis with a bicuspid valve.  Left ventricular function appeared well preserved.  There was no significant mitral regurgitation.  Then the patient was heparinized, and when an adequate time was achieved, the distal ascending aorta was cannulated using a 20 French aortic cannula for arterial in flow.  Venous out flow was achieved using a two stage venous cannula through the right atrial appendage.  Antegrade and Cardioplegia and vent cannula was inserted in the aortic root.  A retrograde cardioplegic cannula was inserted through the right atrium into the coronary sinus.  A left ventricular vent was placed through the right superior pulmonary vein.  The patient was placed on cardiopulmonary bypass.  Then the aorta was cross clamped, and 500 cc of cold blood antegrade cardioplegia was administered in the aortic root with slow arrest of the heart.  There was significant aortic insufficiency.  Therefore, another dose of 300 cc cold blood retrograde Cardioplegia was given with quick arrest of the heart and good myocardial cooling.  Systemic hypothermia at 20 degrees centigrade and topical hypothermia with ice saline was used.  A temperature probe was placed in the septum, and insulating pad in the pericardium.  The aorta was then opened through a transverse incision about 1 cm above the take off of the right coronary artery.  Examination of the native valve showed that it was a bicuspid valve with marked calcification and immobility of the leaflets.  The commissures were fused.  The native valve was excised.  Care  was taken to remove all particular debris.  The left ventricle was irrigated with a large volume of iced saline.  The annulus was decalcified  with rongeurs.  The ventricle was then irrigated with ice saline.  The annulus was sized, and a 25 mm St. Jude mechanical valve was chosen.  Then a series of pledgets 2-0 Ethibond horizontal mattress sutures were placed around the annulus with the pledgets in the sub-annular position.  The sutures were placed through the sonoring on the valve, and the valve lowered into place. The sutures were tied sequentially.  The valve seated nicely, and the leaflets moved without obstruction.  Additional doses of retrograde Cardioplegia were given at about 20 to 30 minute intervals to maintain myocardial temperature about 10 degrees centigrade.  Then, the aneurysm segment of the ascending aorta was excised from a point just above the take off of the coronary arteries to a point just proximal to the anominate artery.  Then this segment of aneurysmal aorta was replaced using a 28 mm Hemashield gold woven double Volar graft.  This was anastomosed proximally to the aortic root in a supracoronary location, and then in a manner using continuous 3-0 Prolene suture.  Then bioglue was applied to the anastomosis for hemostasis.  The graft was cut to the appropriate length.  The distal anastomosis was then performed to the distal aorta in an end-to-end manner using continuous 3-0 Prolene suture.  Bioglue was again used around this anastomosis for hemostasis on the outside of the aorta.  Then the patient  was rewarmed to 37 degrees centigrade.  Then a needle vent was placed in the aortic graft.  The left side of the heart was de-aired.  The head was placed in Trendelenburg position.  Then the cross clamp was removed with a time of 115 minutes.  There was spontaneous return of sinus rhythm.  Two temporary right ventricular and right atrial pacing wires were placed in the skin.  The aortic suture lines appeared hemostatic.  Then after further de-airing maneuvers, the left ventricular vent was removed.  Then the  patient was weaned from cardiopulmonary bypass on no inotropic agents.  Total bypass time was 164 minutes.  Cardiac function appeared excellent, with a cardiac output of 6 L a minute.  Transesophageal echocardiogram was performed, and the St. Jude valve appeared to be functioning normally.  There was no evidence of paravalvular leak or aortic regurgitation.  Then Protamine was given, and the venous and aortic cannulas were removed without difficulty.  Hemostasis was achieved.  Two chest tubes were placed, one in the posterior pericardium and one in the anterior mediastinum.  The pericardium was reapproximated over the heart.  The sternum was closed with #6 stainless steel wires.  The fascia was closed using continuous normal #1 Vicryl suture.  Subcutaneous tissue was closed with continuous 2-0 Vicryl, and the skin with 3-0 Vicryl subcuticular closure.  Sponge, needle, and instrument counts were correct according to the scrub nurse.  A dry sterile dressing was applied over the incision and around the chest tubes which were hooked to Pleuravac suction.  The patient remained hemodynamically stable, and was transported to the SICU in guarded, but stable condition. Dictated by:   Alleen Borne, M.D. Attending Physician:  Cleatrice Burke DD:  10/20/01 TD:  10/20/01 Job: [id number]

## 2011-04-20 NOTE — Op Note (Signed)
[place]. [facility]  Patient:    Jared Rodriguez, Jared Rodriguez                      MRN: [id number] Proc. Date: 03/28/00 Adm. Date:  [id number] Disc. Date: [id number] Attending:  Daisey Must CC:         Jesse Sans. Wall, M.D. [facility]             Wyvonnia Lora, M.D.             Cardiac Catheterization Laboratory                           Operative Report  PROCEDURES PERFORMED:  Right and left heart catheterization with coronary angiography and aortic root angiography.  INDICATIONS:  Jared Rodriguez is a 64 year old male with moderate aortic stenosis secondary to what appears to be a bicuspid aortic valve.  He has been having exertional chest discomfort.   A Cardiolite scan was interpreted as revealing inferior ischemia.  DESCRIPTION OF PROCEDURE:  An 8 French sheath was placed in the right femoral vein, 7 French sheath in the right femoral artery.  We used a standard Swan-Ganz catheter for the right heart catheterization and standard Judkins catheters for the left heart catheterization.  We attempted to cross the aortic valve utilizing several different shaped catheters and a strength wire as well as a Glidewire.  However, due to the orientation of the valve and its significant stenosis we were unable to direct the wire across the valve. Therefore left ventriculography was not performed and we were unable to measure a aortic valve gradient.  We used Omnipaque for contrast.  There were no complications.  CATHETERIZATION RESULTS:  HEMODYNAMICS:  Right atrial pressure A 12, V 10, mean 9.  Right ventricular pressure 30/12 and pulmonary artery pressure 30/15.  Pulmonary capillary wedge pressure, A 16, V 14, mean 12, aortic pressure was 110/63.  Cardiac output by the thermodilution is 4.8 with a cardiac index of 2.2.  Cardiac output by the Fick method is 5.7 with a cardiac index of 2.7.  Aortic root angiography revealed bicuspid aortic valve with moderate calcification and  decreased mobility.  There was 1+ mild aortic insufficiency.  CORONARY ARTERIOGRAPHY:  (Codominant).  Left main normal.  LAD, smooth 25% stenosis at its origin which may possible be catheter-related spasm.  The LAD is otherwise free of disease.  It gives rise to a normal first diagonal and normal sized second diagonal.  Left circumflex is a codominant vessel.  It gives rise to three small obtuse marginal branches, a small first posterolateral branch, a large second posterolateral and a normal sized third posterolateral.  Left circumflex is free of angiographic disease.  Right coronary artery is a codominant vessel ending as a normal sized posterior descending artery.  It is free of angiographic disease.  IMPRESSION: 1. Normal right heart pressures and normal cardiac output. 2. Essentially normal coronary arteries as described with the exception of    possible smooth 25% stenosis at the origin of the left anterior descending    artery. 3. Moderate dilatation of the aortic root with mild aortic insufficiency.    Bicuspid aortic valve with moderate aortic stenosis by echocardiogram.  PLAN:  The patient will follow up with Dr. Daleen Squibb to determine indication and timing for aortic valve replacement. DD:  03/28/00 TD:  03/29/00 Job: [id number]

## 2011-05-08 ENCOUNTER — Ambulatory Visit: Payer: Self-pay | Admitting: Cardiology

## 2011-06-04 ENCOUNTER — Encounter: Payer: Self-pay | Admitting: Cardiology

## 2011-06-04 ENCOUNTER — Ambulatory Visit (INDEPENDENT_AMBULATORY_CARE_PROVIDER_SITE_OTHER): Payer: Medicare Other | Admitting: Cardiology

## 2011-06-04 VITALS — BP 144/78 | HR 67 | Ht 68.0 in | Wt 208.0 lb

## 2011-06-04 DIAGNOSIS — I350 Nonrheumatic aortic (valve) stenosis: Secondary | ICD-10-CM

## 2011-06-04 DIAGNOSIS — I359 Nonrheumatic aortic valve disorder, unspecified: Secondary | ICD-10-CM

## 2011-06-04 DIAGNOSIS — Z954 Presence of other heart-valve replacement: Secondary | ICD-10-CM

## 2011-06-04 DIAGNOSIS — Z7901 Long term (current) use of anticoagulants: Secondary | ICD-10-CM

## 2011-06-04 DIAGNOSIS — I10 Essential (primary) hypertension: Secondary | ICD-10-CM

## 2011-06-04 NOTE — Progress Notes (Signed)
HPI [name] returns for evaluation and management of his history of severe aortic stenosis, bicuspid aortic valve, and status post St. Jude mechanical valve placement in 2002. At that time he had normal left ventricular function. His history hypertension. He had nonobstructive coronary disease.  He is very compliant. He's been followed in [place] was a patient of mine in regional.  He does not smoke.  EKG today shows normal sinus rhythm with occasional PVCs. No acute change.  He denies any dyspnea on exertion, angina or syncope.  He also denies any bleeding on Coumadin. His blood pressure good control. No past medical history on file.  No past surgical history on file.  No family history on file.  History   Social History  . Marital Status: Married    Spouse Name: N/A    Number of Children: N/A  . Years of Education: N/A   Occupational History  . Not on file.   Social History Main Topics  . Smoking status: Never Smoker   . Smokeless tobacco: Never Used  . Alcohol Use: No  . Drug Use: No  . Sexually Active: Not on file   Other Topics Concern  . Not on file   Social History Narrative  . No narrative on file    No Known Allergies  Current Outpatient Prescriptions  Medication Sig Dispense Refill  . acetaminophen (TYLENOL) 500 MG tablet Take 500 mg by mouth every 6 (six) hours as needed.        Marland Kitchen amoxicillin (AMOXIL) 500 MG tablet Take 500 mg by mouth as directed. 4 capsules 1 hour pryor to dental work       . atorvastatin (LIPITOR) 20 MG tablet Take 20 mg by mouth daily.        Marland Kitchen HYDROcodone-acetaminophen (NORCO) 5-325 MG per tablet       . lisinopril-hydrochlorothiazide (PRINZIDE,ZESTORETIC) 20-25 MG per tablet Take 1 tablet by mouth daily.        Marland Kitchen warfarin (COUMADIN) 2 MG tablet       . warfarin (COUMADIN) 5 MG tablet Take 5 mg by mouth as directed.        Marland Kitchen DISCONTD: etodolac (LODINE) 400 MG tablet       . DISCONTD: LIPITOR 40 MG tablet          ROS Negative other than HPI.   PE General Appearance: well developed, well nourished in no acute distress HEENT: symmetrical face, PERRLA, good dentition  Neck: no JVD, thyromegaly, or adenopathy, trachea midline Chest: symmetric without deformity Cardiac: PMI non-displaced, RRR, normal S1, S2, no gallop, Systolic murmur with a split S2, no diastolic murmur heard. Valve with crisp closure, soft murmur radiating to the base of the neck Lung: clear to ausculation and percussion Vascular: all pulses full without bruits  Abdominal: nondistended, nontender, good bowel sounds, no HSM, no bruits Extremities: no cyanosis, clubbing or edema, no sign of DVT, no varicosities  Skin: normal color, no rashes Neuro: alert and oriented x 3, non-focal Pysch: normal affect Filed Vitals:   06/04/11 0943  BP: 144/78  Pulse: 67  Height: 5\' 8"  (1.727 m)  Weight: 208 lb (94.348 kg)  SpO2: 96%    EKG  Labs and Studies Reviewed.   Lab Results  Component Value Date   WBC 10.5 05/28/2009   HGB 12.7* 05/28/2009   HCT 37.5* 05/28/2009   MCV 86.5 05/28/2009   PLT 190 05/28/2009      Chemistry      Component Value  Date/Time   NA 135 05/28/2009 0445   K 3.9 DELTA CHECK NOTED 05/28/2009 0445   CL 104 05/28/2009 0445   CO2 27 05/28/2009 0445   BUN 11 05/28/2009 0445   CREATININE 0.92 05/28/2009 0445      Component Value Date/Time   CALCIUM 8.8 05/28/2009 0445       No results found for this basename: CHOL   No results found for this basename: HDL   No results found for this basename: LDLCALC   No results found for this basename: TRIG   No results found for this basename: CHOLHDL   No results found for this basename: HGBA1C   No results found for this basename: ALT, AST, GGT, ALKPHOS, BILITOT   No results found for this basename: TSH

## 2011-06-04 NOTE — Patient Instructions (Signed)
Your physician recommends that you continue on your current medications as directed. Please refer to the Current Medication list given to you today.  Your physician recommends that you follow up Dr. Margo Common concerning colonoscopy  Your physician recommends that you schedule a follow-up appointment in: 1 year

## 2011-06-04 NOTE — Assessment & Plan Note (Signed)
Status post replacement 10 years ago. He is doing remarkably well. No changes in treatment.

## 2011-06-08 ENCOUNTER — Encounter: Payer: Self-pay | Admitting: Cardiology

## 2011-11-14 ENCOUNTER — Encounter: Payer: Self-pay | Admitting: Cardiology

## 2012-06-06 ENCOUNTER — Encounter: Payer: Self-pay | Admitting: Cardiology

## 2012-06-06 ENCOUNTER — Ambulatory Visit (INDEPENDENT_AMBULATORY_CARE_PROVIDER_SITE_OTHER): Payer: Medicare Other | Admitting: Cardiology

## 2012-06-06 VITALS — BP 130/70 | HR 61 | Ht 69.0 in | Wt 195.0 lb

## 2012-06-06 DIAGNOSIS — E785 Hyperlipidemia, unspecified: Secondary | ICD-10-CM

## 2012-06-06 DIAGNOSIS — I10 Essential (primary) hypertension: Secondary | ICD-10-CM

## 2012-06-06 DIAGNOSIS — I350 Nonrheumatic aortic (valve) stenosis: Secondary | ICD-10-CM

## 2012-06-06 DIAGNOSIS — I359 Nonrheumatic aortic valve disorder, unspecified: Secondary | ICD-10-CM

## 2012-06-06 DIAGNOSIS — Z954 Presence of other heart-valve replacement: Secondary | ICD-10-CM

## 2012-06-06 NOTE — Progress Notes (Signed)
HPI Jared Rodriguez returns today for evaluation and management of his history of severe aortic stenosis, status post St. Jude aortic valve replacement about 10 years ago.  Dr. Margo Common a primary care falls his lipids, other blood work, anticoagulation.  HEENT denies any chest pain, shortness of breath, orthopnea, PND, palpitations presyncope or syncope. He practices dental prophylaxis using amoxicillin. He seems to be very compliant. 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  No past medical history on file.  Current Outpatient Prescriptions  Medication Sig Dispense Refill  . acetaminophen (TYLENOL) 500 MG tablet Take 500 mg by mouth every 6 (six) hours as needed.        Marland Kitchen amoxicillin (AMOXIL) 500 MG tablet Take 500 mg by mouth as directed. 4 capsules 1 hour pryor to dental work       . atorvastatin (LIPITOR) 20 MG tablet Take 20 mg by mouth daily.        Marland Kitchen lisinopril-hydrochlorothiazide (PRINZIDE,ZESTORETIC) 20-25 MG per tablet Take 1 tablet by mouth daily.        Marland Kitchen warfarin (COUMADIN) 2 MG tablet       . warfarin (COUMADIN) 5 MG tablet Take 5 mg by mouth as directed.        Marland Kitchen HYDROcodone-acetaminophen (NORCO) 5-325 MG per tablet         No Known Allergies  No family history on file.  History   Social History  . Marital Status: Married    Spouse Name: N/A    Number of Children: N/A  . Years of Education: N/A   Occupational History  . Not on file.   Social History Main Topics  . Smoking status: Never Smoker   . Smokeless tobacco: Never Used  . Alcohol Use: No  . Drug Use: No  . Sexually Active: Not on file   Other Topics Concern  . Not on file   Social History Narrative  . No narrative on file    ROS ALL NEGATIVE EXCEPT THOSE NOTED IN HPI  PE  General Appearance: well developed, well nourished in no acute distress, obese HEENT: symmetrical face, PERRLA, good dentition  Neck: no JVD, thyromegaly, or adenopathy, trachea midline Chest: symmetric without  deformity Cardiac: PMI non-displaced, RRR, normal S1, prosthetic S2 that splits no gallop or murmur Lung: clear to ausculation and percussion Vascular: all pulses full without bruits  Abdominal: nondistended, nontender, good bowel sounds, no HSM, no bruits Extremities: no cyanosis, clubbing or edema, no sign of DVT, no varicosities . Right leg is less muscular than his left leg. Skin: normal color, no rashes Neuro: alert and oriented x 3, non-focal Pysch: normal affect  EKG Sinus bradycardia, normal EKG otherwise BMET    Component Value Date/Time   NA 135 05/28/2009 0445   K 3.9 DELTA CHECK NOTED 05/28/2009 0445   CL 104 05/28/2009 0445   CO2 27 05/28/2009 0445   GLUCOSE 137* 05/28/2009 0445   BUN 11 05/28/2009 0445   CREATININE 0.92 05/28/2009 0445   CALCIUM 8.8 05/28/2009 0445   GFRNONAA >60 05/28/2009 0445   GFRAA  Value: >60        The eGFR has been calculated using the MDRD equation. This calculation has not been validated in all clinical situations. eGFR's persistently <60 mL/min signify possible Chronic Kidney Disease. 05/28/2009 0445    Lipid Panel  No results found for this basename: chol, trig, hdl, cholhdl, vldl, ldlcalc    CBC    Component Value Date/Time   WBC 10.5  05/28/2009 0445   RBC 4.34 05/28/2009 0445   HGB 12.7* 05/28/2009 0445   HCT 37.5* 05/28/2009 0445   PLT 190 05/28/2009 0445   MCV 86.5 05/28/2009 0445   MCHC 34.0 05/28/2009 0445   RDW 13.1 05/28/2009 0445

## 2012-06-06 NOTE — Addendum Note (Signed)
Addended by: Reather Laurence A on: 06/06/2012 10:16 AM   Modules accepted: Orders

## 2012-06-06 NOTE — Patient Instructions (Signed)
Your physician recommends that you schedule a follow-up appointment in: 1 year  

## 2012-06-06 NOTE — Assessment & Plan Note (Signed)
He is status post aortic valve replacement with St. Jude valve. He is totally asymptomatic and doing well. Continue following with Dr. Margo Common for anticoagulation and other issues. Return to office in a year. Echocardiography not indicated. Dental prophylaxis reviewed again.

## 2013-07-17 ENCOUNTER — Ambulatory Visit (INDEPENDENT_AMBULATORY_CARE_PROVIDER_SITE_OTHER): Payer: Medicare Other | Admitting: Cardiology

## 2013-07-17 ENCOUNTER — Encounter: Payer: Self-pay | Admitting: Cardiology

## 2013-07-17 VITALS — BP 128/78 | HR 60 | Ht 69.0 in | Wt 210.0 lb

## 2013-07-17 DIAGNOSIS — Z954 Presence of other heart-valve replacement: Secondary | ICD-10-CM

## 2013-07-17 DIAGNOSIS — I359 Nonrheumatic aortic valve disorder, unspecified: Secondary | ICD-10-CM

## 2013-07-17 DIAGNOSIS — I350 Nonrheumatic aortic (valve) stenosis: Secondary | ICD-10-CM

## 2013-07-17 DIAGNOSIS — E785 Hyperlipidemia, unspecified: Secondary | ICD-10-CM

## 2013-07-17 DIAGNOSIS — I10 Essential (primary) hypertension: Secondary | ICD-10-CM

## 2013-07-17 NOTE — Patient Instructions (Signed)
Your physician recommends that you schedule a follow-up appointment in: 1 year with Dr Koneswaran You will receive a reminder letter two months in advance reminding you to call and schedule your appointment. If you don't receive this letter, please contact our office.  Your physician recommends that you continue on your current medications as directed. Please refer to the Current Medication list given to you today.     

## 2013-07-17 NOTE — Progress Notes (Signed)
HPI Jared Rodriguez returns today for evaluation and management of his history of aortic stenosis and aortic valve replacement. He is doing remarkably well. His lipids are followed as his blood pressure is as well by primary care in [place].   Denies any bleeding or melena. 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 No cardiac symptoms.  History reviewed. No pertinent past medical history.  Current Outpatient Prescriptions  Medication Sig Dispense Refill  . acetaminophen (TYLENOL) 500 MG tablet Take 500 mg by mouth every 6 (six) hours as needed.        Marland Kitchen atorvastatin (LIPITOR) 20 MG tablet Take 20 mg by mouth daily.        Marland Kitchen HYDROcodone-acetaminophen (NORCO) 5-325 MG per tablet as needed.       Marland Kitchen lisinopril-hydrochlorothiazide (PRINZIDE,ZESTORETIC) 20-25 MG per tablet Take 1 tablet by mouth daily.        Marland Kitchen warfarin (COUMADIN) 2 MG tablet as directed.       . warfarin (COUMADIN) 5 MG tablet Take 5 mg by mouth as directed.        Marland Kitchen amoxicillin (AMOXIL) 500 MG tablet Take 500 mg by mouth as directed. 4 capsules 1 hour pryor to dental work        No current facility-administered medications for this visit.    No Known Allergies  No family history on file.  History   Social History  . Marital Status: Married    Spouse Name: N/A    Number of Children: N/A  . Years of Education: N/A   Occupational History  . Not on file.   Social History Main Topics  . Smoking status: Never Smoker   . Smokeless tobacco: Never Used  . Alcohol Use: No  . Drug Use: No  . Sexual Activity: Not on file   Other Topics Concern  . Not on file   Social History Narrative  . No narrative on file    ROS ALL NEGATIVE EXCEPT THOSE NOTED IN HPI  PE  General Appearance: well developed, well nourished in no acute distress HEENT: symmetrical face, PERRLA, good dentition  Neck: no JVD, thyromegaly, or adenopathy, trachea midline Chest: symmetric without deformity Cardiac: PMI non-displaced, RRR, normal  S1, S2, no gallop or murmur Lung: clear to ausculation and percussion Vascular: all pulses full without bruits  Abdominal: nondistended, nontender, good bowel sounds, no HSM, no bruits Extremities: no cyanosis, clubbing or edema, no sign of DVT, no varicosities  Skin: normal color, no rashes Neuro: alert and oriented x 3, non-focal Pysch: normal affect  EKG Normal sinus rhythm, normal EKG BMET    Component Value Date/Time   NA 135 05/28/2009 0445   K 3.9 DELTA CHECK NOTED 05/28/2009 0445   CL 104 05/28/2009 0445   CO2 27 05/28/2009 0445   GLUCOSE 137* 05/28/2009 0445   BUN 11 05/28/2009 0445   CREATININE 0.92 05/28/2009 0445   CALCIUM 8.8 05/28/2009 0445   GFRNONAA >60 05/28/2009 0445   GFRAA  Value: >60        The eGFR has been calculated using the MDRD equation. This calculation has not been validated in all clinical situations. eGFR's persistently <60 mL/min signify possible Chronic Kidney Disease. 05/28/2009 0445    Lipid Panel  No results found for this basename: chol, trig, hdl, cholhdl, vldl, ldlcalc    CBC    Component Value Date/Time   WBC 10.5 05/28/2009 0445   RBC 4.34 05/28/2009 0445   HGB 12.7* 05/28/2009 0445  HCT 37.5* 05/28/2009 0445   PLT 190 05/28/2009 0445   MCV 86.5 05/28/2009 0445   MCHC 34.0 05/28/2009 0445   RDW 13.1 05/28/2009 0445

## 2013-07-17 NOTE — Assessment & Plan Note (Signed)
Status post aortic valve replacement. Stable clinically and by exam. No indication for echocardiography. No change in current plan. Followup in one year.

## 2014-08-02 ENCOUNTER — Encounter: Payer: Self-pay | Admitting: Cardiovascular Disease

## 2014-08-02 ENCOUNTER — Ambulatory Visit (INDEPENDENT_AMBULATORY_CARE_PROVIDER_SITE_OTHER): Payer: Medicare HMO | Admitting: Cardiovascular Disease

## 2014-08-02 VITALS — BP 142/62 | HR 69 | Ht 69.0 in | Wt 220.0 lb

## 2014-08-02 DIAGNOSIS — Z9889 Other specified postprocedural states: Secondary | ICD-10-CM

## 2014-08-02 DIAGNOSIS — I10 Essential (primary) hypertension: Secondary | ICD-10-CM

## 2014-08-02 DIAGNOSIS — Z8679 Personal history of other diseases of the circulatory system: Secondary | ICD-10-CM

## 2014-08-02 DIAGNOSIS — Z952 Presence of prosthetic heart valve: Secondary | ICD-10-CM

## 2014-08-02 DIAGNOSIS — Z954 Presence of other heart-valve replacement: Secondary | ICD-10-CM

## 2014-08-02 DIAGNOSIS — E785 Hyperlipidemia, unspecified: Secondary | ICD-10-CM

## 2014-08-02 NOTE — Progress Notes (Addendum)
Patient ID: Jared Rodriguez, male   DOB: 21-Jun-1946, 68 y.o.   MRN: [id number]      SUBJECTIVE: The patient is a 68 year old male who I am evaluating for the first time. 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 He underwent aortic valve replacement using a 25 mm St. Jude mechanical valve and replacement of the  ascending aortic aneurysm using a 20 mm Hemashield graft on 10/20/2001.  He is a [employer] architect. He had an accident at work in April and has been undergoing physical therapy for this. Since stopping work he has gained 10 pounds. He has been slightly more dyspneic with exertion since the weight gain. He denies chest pain and leg swelling.  ECG performed in the office today demonstrates normal sinus rhythm with LVH with a nonspecific intraventricular conduction delay likely secondary to repolarization abnormalities.  Review of Systems: As per "subjective", otherwise negative.  No Known Allergies  Current Outpatient Prescriptions  Medication Sig Dispense Refill  . acetaminophen (TYLENOL) 500 MG tablet Take 500 mg by mouth every 6 (six) hours as needed.        Marland Kitchen amoxicillin (AMOXIL) 500 MG tablet Take 500 mg by mouth as directed. 4 capsules 1 hour pryor to dental work       . atorvastatin (LIPITOR) 20 MG tablet Take 20 mg by mouth daily.        Marland Kitchen HYDROcodone-acetaminophen (NORCO) 5-325 MG per tablet as needed.       Marland Kitchen lisinopril-hydrochlorothiazide (PRINZIDE,ZESTORETIC) 20-25 MG per tablet Take 1 tablet by mouth daily.        Marland Kitchen warfarin (COUMADIN) 2 MG tablet as directed.       . warfarin (COUMADIN) 5 MG tablet Take 5 mg by mouth as directed.         No current facility-administered medications for this visit.    No past medical history on file.  No past surgical history on file.  History   Social History  . Marital Status: Married    Spouse Name: N/A    Number of Children: N/A  .  Years of Education: N/A   Occupational History  . Not on file.   Social History Main Topics  . Smoking status: Never Smoker   . Smokeless tobacco: Never Used  . Alcohol Use: No  . Drug Use: No  . Sexual Activity: Not on file   Other Topics Concern  . Not on file   Social History Narrative  . No narrative on file     Filed Vitals:   08/02/14 1528  BP: 142/62  Pulse: 69  Height:  (1.753 m)  Weight: 220 lb (99.791 kg)  SpO2: 95%    PHYSICAL EXAM General: NAD HEENT: Normal. Neck: No JVD, no thyromegaly. Lungs: Clear to auscultation bilaterally with normal respiratory effort. CV: Nondisplaced PMI.  Regular rate and rhythm, normal S1/mechanical S2 click, no S3/S4, soft II/VI systolic murmur over RUSB and LUSB. No pretibial or periankle edema.  No carotid bruit.   Abdomen: Soft, nontender, no hepatosplenomegaly, no distention.  Neurologic: Alert and oriented x 3.  Psych: Normal affect. Skin: Normal. Musculoskeletal: Normal range of motion, no gross deformities. Extremities: No clubbing or cyanosis.   ECG: Most recent ECG reviewed.      ASSESSMENT AND PLAN: 1. Bicuspid AV with severe aortic stenosis s/p mechanical AVR and ascending aortic aneurysm replacement: Stable. No symptoms. No changes to therapy.  2. Essential HTN:  Mildly elevated SBP today. Continue to monitor. Likely a reflection of 10 lb weight gain. Weight loss encouraged.  3. Hyperlipidemia: On statin therapy. Lipids to be checked by PCP in October.  4. Weight loss encouraged.  Dispo: f/u 1 year.  Prentice Docker, M.D., F.A.C.C.

## 2014-08-02 NOTE — Patient Instructions (Signed)
Your physician wants you to follow-up in: 1 year You will receive a reminder letter in the mail two months in advance. If you don't receive a letter, please call our office to schedule the follow-up appointment.    Your physician recommends that you continue on your current medications as directed. Please refer to the Current Medication list given to you today.     Thank you for choosing [facility] !  

## 2015-05-27 ENCOUNTER — Encounter: Payer: Self-pay | Admitting: Cardiovascular Disease

## 2015-08-04 ENCOUNTER — Ambulatory Visit: Payer: Commercial Managed Care - HMO | Admitting: Cardiovascular Disease

## 2015-09-01 ENCOUNTER — Ambulatory Visit (INDEPENDENT_AMBULATORY_CARE_PROVIDER_SITE_OTHER): Payer: Medicare HMO | Admitting: Cardiovascular Disease

## 2015-09-01 ENCOUNTER — Encounter: Payer: Self-pay | Admitting: Cardiovascular Disease

## 2015-09-01 VITALS — BP 132/74 | HR 82 | Ht 69.0 in | Wt 206.2 lb

## 2015-09-01 DIAGNOSIS — E785 Hyperlipidemia, unspecified: Secondary | ICD-10-CM

## 2015-09-01 DIAGNOSIS — Z9889 Other specified postprocedural states: Secondary | ICD-10-CM | POA: Diagnosis not present

## 2015-09-01 DIAGNOSIS — I10 Essential (primary) hypertension: Secondary | ICD-10-CM | POA: Diagnosis not present

## 2015-09-01 DIAGNOSIS — Z954 Presence of other heart-valve replacement: Secondary | ICD-10-CM

## 2015-09-01 DIAGNOSIS — Z952 Presence of prosthetic heart valve: Secondary | ICD-10-CM

## 2015-09-01 DIAGNOSIS — Z8679 Personal history of other diseases of the circulatory system: Secondary | ICD-10-CM

## 2015-09-01 NOTE — Progress Notes (Signed)
Patient ID: Jared Rodriguez, male   DOB: [birth year], 69 y.o.   MRN: [id number]      SUBJECTIVE: The patient presents for routine follow-up. 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 He underwent aortic valve replacement using a 25 mm St. Jude mechanical valve and replacement of the  ascending aortic aneurysm using a 20 mm Hemashield graft on 10/20/2001.  He feels well and denies chest pain, shortness of breath, dizziness, and leg swelling.  He is a [employer].   Review of Systems: As per "subjective", otherwise negative.  No Known Allergies  Current Outpatient Prescriptions  Medication Sig Dispense Refill  . acetaminophen (TYLENOL) 500 MG tablet Take 500 mg by mouth every 6 (six) hours as needed.      Marland Kitchen amoxicillin (AMOXIL) 500 MG tablet Take 500 mg by mouth as directed. 4 capsules 1 hour pryor to dental work     . atorvastatin (LIPITOR) 20 MG tablet Take 20 mg by mouth daily.      Marland Kitchen HYDROcodone-acetaminophen (NORCO) 5-325 MG per tablet as needed.     Marland Kitchen lisinopril-hydrochlorothiazide (PRINZIDE,ZESTORETIC) 20-25 MG per tablet Take 1 tablet by mouth daily.      . metFORMIN (GLUCOPHAGE) 500 MG tablet Take 500 mg by mouth daily.    Marland Kitchen warfarin (COUMADIN) 2 MG tablet as directed.     . warfarin (COUMADIN) 5 MG tablet Take 5 mg by mouth as directed.       No current facility-administered medications for this visit.    No past medical history on file.  No past surgical history on file.  Social History   Social History  . Marital Status: Married    Spouse Name: N/A  . Number of Children: N/A  . Years of Education: N/A   Occupational History  . Not on file.   Social History Main Topics  . Smoking status: Never Smoker   . Smokeless tobacco: Never Used  . Alcohol Use: No  . Drug Use: No  . Sexual Activity: Not on file   Other Topics Concern  . Not on file   Social History  Narrative     Filed Vitals:   09/01/15 1625  BP: 132/74  Pulse: 82  Height:  (1.753 m)  Weight: 206 lb 3.2 oz (93.532 kg)  SpO2: 96%    PHYSICAL EXAM General: NAD HEENT: Normal. Neck: No JVD, no thyromegaly. Lungs: Clear to auscultation bilaterally with normal respiratory effort. CV: Nondisplaced PMI. Regular rate and rhythm, normal S1/mechanical S2 click, no S3/S4, soft I/VI systolic murmur over RUSB and LUSB. No pretibial or periankle edema. No carotid bruit.  Abdomen: Soft, nontender, no distention.  Neurologic: Alert and oriented x 3.  Psych: Normal affect. Skin: Normal. Musculoskeletal: No gross deformities. Extremities: No clubbing or cyanosis.   ECG: Most recent ECG reviewed.      ASSESSMENT AND PLAN: 1. Bicuspid AV with severe aortic stenosis s/p mechanical AVR and ascending aortic aneurysm replacement: Stable. No symptoms. No changes to therapy.  2. Essential HTN: Controlled. No changes.  3. Hyperlipidemia: On statin therapy. Will obtain copy of lipids from PCP.  Dispo: f/u 1 year. 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   Prentice Docker, M.D., F.A.C.C.

## 2015-09-01 NOTE — Patient Instructions (Signed)
Your physician wants you to follow-up in: 1 year with Dr Koneswaran You will receive a reminder letter in the mail two months in advance. If you don't receive a letter, please call our office to schedule the follow-up appointment.    Your physician recommends that you continue on your current medications as directed. Please refer to the Current Medication list given to you today.     Thank you for choosing [facility] !        

## 2016-09-03 ENCOUNTER — Encounter: Payer: Self-pay | Admitting: Cardiovascular Disease

## 2016-09-03 ENCOUNTER — Ambulatory Visit (INDEPENDENT_AMBULATORY_CARE_PROVIDER_SITE_OTHER): Payer: Medicare HMO | Admitting: Cardiovascular Disease

## 2016-09-03 VITALS — BP 132/56 | HR 70 | Ht 69.0 in | Wt 210.0 lb

## 2016-09-03 DIAGNOSIS — I10 Essential (primary) hypertension: Secondary | ICD-10-CM

## 2016-09-03 DIAGNOSIS — E7800 Pure hypercholesterolemia, unspecified: Secondary | ICD-10-CM

## 2016-09-03 DIAGNOSIS — Z9889 Other specified postprocedural states: Secondary | ICD-10-CM | POA: Diagnosis not present

## 2016-09-03 DIAGNOSIS — Z952 Presence of prosthetic heart valve: Secondary | ICD-10-CM | POA: Diagnosis not present

## 2016-09-03 DIAGNOSIS — Z8679 Personal history of other diseases of the circulatory system: Secondary | ICD-10-CM

## 2016-09-03 DIAGNOSIS — Z23 Encounter for immunization: Secondary | ICD-10-CM

## 2016-09-03 NOTE — Patient Instructions (Signed)
Your physician wants you to follow-up in: 1 Year with Dr. Koneswaran, You will receive a reminder letter in the mail two months in advance. If you don't receive a letter, please call our office to schedule the follow-up appointment.  Your physician recommends that you continue on your current medications as directed. Please refer to the Current Medication list given to you today.  If you need a refill on your cardiac medications before your next appointment, please call your pharmacy.  Thank you for choosing [facility]!   

## 2016-09-03 NOTE — Progress Notes (Signed)
      SUBJECTIVE: The patient presents for routine follow-up. 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 He underwent aortic valve replacement using a 25 mm St. Jude mechanical valve and replacement of the ascending aortic aneurysm using a 20 mm Hemashield graft on 10/20/2001.  He feels well and denies chest pain, shortness of breath, dizziness, and leg swelling.  He now trucks part time.  ECG performed in the office today which I personally interpreted demonstrates normal sinus rhythm with nonspecific interventricular conduction delay.   Review of Systems: As per "subjective", otherwise negative.  No Known Allergies  Current Outpatient Prescriptions  Medication Sig Dispense Refill  . acetaminophen (TYLENOL) 500 MG tablet Take 500 mg by mouth every 6 (six) hours as needed.      Marland Kitchen. atorvastatin (LIPITOR) 20 MG tablet Take 20 mg by mouth daily.      Marland Kitchen. lisinopril-hydrochlorothiazide (PRINZIDE,ZESTORETIC) 20-25 MG per tablet Take 1 tablet by mouth daily.      . metFORMIN (GLUCOPHAGE) 500 MG tablet Take 500 mg by mouth daily.    Marland Kitchen. warfarin (COUMADIN) 2 MG tablet as directed.     . warfarin (COUMADIN) 5 MG tablet Take 5 mg by mouth as directed.       No current facility-administered medications for this visit.     Past Medical History:  Diagnosis Date  . Diabetes mellitus without complication (HCC)   . Hypertension     No past surgical history on file.  Social History   Social History  . Marital status: Married    Spouse name: N/A  . Number of children: N/A  . Years of education: N/A   Occupational History  . Not on file.   Social History Main Topics  . Smoking status: Never Smoker  . Smokeless tobacco: Never Used  . Alcohol use No  . Drug use: No  . Sexual activity: Not on file   Other Topics Concern  . Not on file   Social History Narrative  . No narrative on file      Vitals:   09/03/16 1037  BP: (!) 132/56  Pulse: 70  SpO2: 96%  Weight: 210 lb (95.3 kg)  Height: 5\' 9"  (1.753 m)    PHYSICAL EXAM General: NAD HEENT: Normal. Neck: No JVD, no thyromegaly. Lungs: Clear to auscultation bilaterally with normal respiratory effort. CV: Nondisplaced PMI. Regular rate and rhythm, normal S1/mechanical S2 click, no S3/S4, soft I/VI systolic murmur over RUSB and LUSB. No pretibial or periankle edema. No carotid bruit.  Abdomen: Soft, nontender, no distention.  Neurologic: Alert and oriented.  Psych: Normal affect. Skin: Normal. Musculoskeletal: No gross deformities.    ECG: Most recent ECG reviewed.      ASSESSMENT AND PLAN: 1. Bicuspid AV with severe aortic stenosis s/p mechanical AVR and ascending aortic aneurysm replacement: Stable. No symptoms. No changes to therapy.  2. Essential HTN: Controlled. No changes.  3. Hyperlipidemia: On statin therapy.   Dispo: f/u 1 year.   Prentice DockerSuresh Trayton Szabo, M.D., F.A.C.C.

## 2017-09-20 ENCOUNTER — Encounter: Payer: Self-pay | Admitting: Cardiovascular Disease

## 2017-09-20 ENCOUNTER — Ambulatory Visit (INDEPENDENT_AMBULATORY_CARE_PROVIDER_SITE_OTHER): Payer: Medicare HMO | Admitting: Cardiovascular Disease

## 2017-09-20 VITALS — BP 136/72 | HR 61 | Ht 69.0 in | Wt 208.0 lb

## 2017-09-20 DIAGNOSIS — E7800 Pure hypercholesterolemia, unspecified: Secondary | ICD-10-CM

## 2017-09-20 DIAGNOSIS — Z952 Presence of prosthetic heart valve: Secondary | ICD-10-CM

## 2017-09-20 DIAGNOSIS — I10 Essential (primary) hypertension: Secondary | ICD-10-CM

## 2017-09-20 NOTE — Patient Instructions (Signed)
Medication Instructions:  Your physician recommends that you continue on your current medications as directed. Please refer to the Current Medication list given to you today.   Labwork: NONE   Testing/Procedures: Your physician has requested that you have an echocardiogram. Echocardiography is a painless test that uses sound waves to create images of your heart. It provides your doctor with information about the size and shape of your heart and how well your heart's chambers and valves are working. This procedure takes approximately one hour. There are no restrictions for this procedure.    Follow-Up: Your physician wants you to follow-up in: 1 Year with Dr. Koneswaran. You will receive a reminder letter in the mail two months in advance. If you don't receive a letter, please call our office to schedule the follow-up appointment.   Any Other Special Instructions Will Be Listed Below (If Applicable).     If you need a refill on your cardiac medications before your next appointment, please call your pharmacy. Thank you for choosing [facility]!    

## 2017-09-20 NOTE — Progress Notes (Signed)
SUBJECTIVE: The patient presents for routine follow-up. 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 He underwent aortic valve replacement using a 25 mm St. Jude mechanical valve and replacement of the ascending aortic aneurysm using a 20 mm Hemashield graft on 10/20/2001.  The patient denies any symptoms of chest pain, palpitations, shortness of breath, lightheadedness, dizziness, leg swelling, orthopnea, PND, and syncope.   He stays active doing outdoor work, gardening, and mowing the grass.  ECG performed in the office today which I ordered and personally interpreted demonstrated sinus rhythm with PVCs and nonspecific ST segment and T-wave abnormalities.      Review of Systems: As per "subjective", otherwise negative.  No Known Allergies  Current Outpatient Prescriptions  Medication Sig Dispense Refill  . acetaminophen (TYLENOL) 500 MG tablet Take 500 mg by mouth every 6 (six) hours as needed.      Marland Kitchen atorvastatin (LIPITOR) 20 MG tablet Take 20 mg by mouth daily.      Marland Kitchen lisinopril-hydrochlorothiazide (PRINZIDE,ZESTORETIC) 20-25 MG per tablet Take 1 tablet by mouth daily.      . metFORMIN (GLUCOPHAGE) 500 MG tablet Take 500 mg by mouth daily.    Marland Kitchen warfarin (COUMADIN) 2 MG tablet as directed.     . warfarin (COUMADIN) 5 MG tablet Take 5 mg by mouth as directed.       No current facility-administered medications for this visit.     Past Medical History:  Diagnosis Date  . Diabetes mellitus without complication (HCC)   . Hypertension     History reviewed. No pertinent surgical history.  Social History   Social History  . Marital status: Married    Spouse name: N/A  . Number of children: N/A  . Years of education: N/A   Occupational History  . Not on file.   Social History Main Topics  . Smoking status: Never Smoker  . Smokeless tobacco: Never Used  . Alcohol use No  .  Drug use: No  . Sexual activity: Not on file   Other Topics Concern  . Not on file   Social History Narrative  . No narrative on file     Vitals:   09/20/17 1005  BP: 136/72  Pulse: 61  SpO2: 96%  Weight: 208 lb (94.3 kg)  Height: 5\' 9"  (1.753 m)    Wt Readings from Last 3 Encounters:  09/20/17 208 lb (94.3 kg)  09/03/16 210 lb (95.3 kg)  09/01/15 206 lb 3.2 oz (93.5 kg)     PHYSICAL EXAM General: NAD HEENT: Normal. Neck: No JVD, no thyromegaly. Lungs: Clear to auscultation bilaterally with normal respiratory effort. CV: Regular rate and rhythm, normal S1/mechanical S2 click, no S3/S4, soft I/VI systolic murmur over RUSB and LUSB. No pretibial or periankle edema.  No carotid bruit.   Abdomen: Soft, nontender, no distention.  Neurologic: Alert and oriented.  Psych: Normal affect. Skin: Normal. Musculoskeletal: No gross deformities.    ECG: Most recent ECG reviewed.   Labs: Lab Results  Component Value Date/Time   K 3.9 DELTA CHECK NOTED 05/28/2009 04:45 AM   BUN 11 05/28/2009 04:45 AM   CREATININE 0.92 05/28/2009 04:45 AM   HGB 12.7 (L) 05/28/2009 04:45 AM     Lipids: No results found for: LDLCALC, LDLDIRECT, CHOL, TRIG, HDL     ASSESSMENT AND PLAN:  1. Bicuspid AV with severe aortic stenosis s/p mechanical AVR and ascending aortic aneurysm replacement: Stable.  No symptoms. No changes to therapy. I will obtain an echocardiogram to assess valve integrity.  2. Essential HTN: Controlled. No changes.  3. Hyperlipidemia: On statin therapy.       Disposition: Follow up 1 year.   Prentice DockerSuresh Koneswaran, M.D., F.A.C.C.

## 2017-09-25 ENCOUNTER — Ambulatory Visit (HOSPITAL_COMMUNITY)
Admission: RE | Admit: 2017-09-25 | Discharge: 2017-09-25 | Disposition: A | Discharge status: Home / Self Care | Payer: Medicare HMO | Source: Ambulatory Visit | Attending: Cardiovascular Disease | Admitting: Cardiovascular Disease

## 2017-09-25 DIAGNOSIS — I350 Nonrheumatic aortic (valve) stenosis: Secondary | ICD-10-CM | POA: Diagnosis present

## 2017-09-25 DIAGNOSIS — Z48812 Encounter for surgical aftercare following surgery on the circulatory system: Secondary | ICD-10-CM | POA: Diagnosis not present

## 2017-09-25 DIAGNOSIS — I340 Nonrheumatic mitral (valve) insufficiency: Secondary | ICD-10-CM | POA: Diagnosis not present

## 2017-09-25 DIAGNOSIS — Z952 Presence of prosthetic heart valve: Secondary | ICD-10-CM | POA: Insufficient documentation

## 2017-09-25 DIAGNOSIS — I712 Thoracic aortic aneurysm, without rupture: Secondary | ICD-10-CM | POA: Insufficient documentation

## 2017-09-25 DIAGNOSIS — I10 Essential (primary) hypertension: Secondary | ICD-10-CM | POA: Diagnosis not present

## 2017-09-25 DIAGNOSIS — E785 Hyperlipidemia, unspecified: Secondary | ICD-10-CM | POA: Diagnosis not present

## 2017-09-25 LAB — ECHOCARDIOGRAM COMPLETE
AV Peak grad: 38 mmHg
AVCELMEANRAT: 0.3
AVG: 19 mmHg
AVLVOTPG: 3 mmHg
AVPKVEL: 309 cm/s
Ao pk vel: 0.28 m/s
CHL CUP DOP CALC LVOT VTI: 19.6 cm
CHL CUP TV REG PEAK VELOCITY: 269 cm/s
DOP CAL AO MEAN VELOCITY: 206 cm/s
E decel time: 352 msec
E/e' ratio: 10.17
FS: 32 % (ref 28–44)
IV/PV OW: 0.62
LA diam end sys: 44 mm
LA diam index: 2.1 cm/m2
LASIZE: 44 mm
LAVOL: 88.4 mL
LAVOLA4C: 93.1 mL
LAVOLIN: 42.1 mL/m2
LV E/e' medial: 10.17
LV E/e'average: 10.17
LVELAT: 8.92 cm/s
LVOTPV: 85.4 cm/s
LVOTVTI: 0.29 cm
Lateral S' vel: 12.4 cm/s
MV Dec: 352
MV Peak grad: 3 mmHg
MVPKAVEL: 109 m/s
MVPKEVEL: 90.7 m/s
PW: 15 mm — AB (ref 0.6–1.1)
RV TAPSE: 20.4 mm
RV sys press: 32 mmHg
TDI e' lateral: 8.92
TDI e' medial: 6.74
TRMAXVEL: 269 cm/s
VTI: 213 cm
VTI: 67.1 cm

## 2017-09-25 NOTE — Progress Notes (Signed)
*  PRELIMINARY RESULTS* Echocardiogram 2D Echocardiogram has been performed.  Jeryl Columbialliott, Nikea Settle 09/25/2017, 3:09 PM

## 2017-10-02 ENCOUNTER — Telehealth: Payer: Self-pay | Admitting: Cardiovascular Disease

## 2017-10-02 NOTE — Telephone Encounter (Signed)
Results of echocardiogram / tg

## 2017-10-02 NOTE — Telephone Encounter (Signed)
Pt notified of echo results

## 2018-09-22 ENCOUNTER — Encounter: Payer: Self-pay | Admitting: Cardiovascular Disease

## 2018-09-22 ENCOUNTER — Ambulatory Visit: Payer: Medicare HMO | Admitting: Cardiovascular Disease

## 2018-09-22 VITALS — BP 126/58 | HR 60 | Ht 69.0 in | Wt 213.0 lb

## 2018-09-22 DIAGNOSIS — I10 Essential (primary) hypertension: Secondary | ICD-10-CM | POA: Diagnosis not present

## 2018-09-22 DIAGNOSIS — E119 Type 2 diabetes mellitus without complications: Secondary | ICD-10-CM | POA: Diagnosis not present

## 2018-09-22 DIAGNOSIS — Z952 Presence of prosthetic heart valve: Secondary | ICD-10-CM

## 2018-09-22 DIAGNOSIS — E7800 Pure hypercholesterolemia, unspecified: Secondary | ICD-10-CM | POA: Diagnosis not present

## 2018-09-22 NOTE — Progress Notes (Signed)
SUBJECTIVE: The patient presents for routine follow-up. 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 He underwent aortic valve replacement using a 25 mm St. Jude mechanical valve and replacement of the ascending aortic aneurysm using a 20 mm Hemashield graft on 10/20/2001.  Echocardiogram 09/25/2017 demonstrated normal left ventricular systolic function and normal regional wall motion, LVEF 50 to 55%, grade 1 diastolic dysfunction, normally functioning mechanical aortic prosthesis with trivial regurgitation, mild mitral regurgitation, and mild to moderate left atrial dilatation.  He is doing very well and denies chest pain, palpitations, shortness of breath, leg swelling, orthopnea, and paroxysmal nocturnal dyspnea.  He stays very active doing yard work including mowing the grass and shopping wood.  When the weather is good he walks about 2 miles a day and can walk up and down hill without any difficulties.  During inclement weather, he walks around his basement as much as possible.  ECG performed in the office today which I ordered and personally interpreted demonstrates sinus bradycardia, 58 bpm, with no ischemic ST segment or T-wave abnormalities, nor any arrhythmias.    Review of Systems: As per "subjective", otherwise negative.  No Known Allergies  Current Outpatient Medications  Medication Sig Dispense Refill  . amLODipine (NORVASC) 5 MG tablet Take 5 mg by mouth daily.    Marland Kitchen atorvastatin (LIPITOR) 20 MG tablet Take 20 mg by mouth daily.      Marland Kitchen lisinopril (PRINIVIL,ZESTRIL) 30 MG tablet Take 30 mg by mouth daily.    . metFORMIN (GLUCOPHAGE) 500 MG tablet Take 500 mg by mouth daily.    Marland Kitchen tiZANidine (ZANAFLEX) 2 MG tablet Take 2 mg by mouth at bedtime.     Marland Kitchen warfarin (COUMADIN) 1 MG tablet Take 1 mg by mouth daily.    Marland Kitchen warfarin (COUMADIN) 5 MG tablet Take 5 mg by mouth daily.    . colchicine  0.6 MG tablet Take 0.6 mg by mouth daily.     No current facility-administered medications for this visit.     Past Medical History:  Diagnosis Date  . Diabetes mellitus without complication (HCC)   . Hypertension     Past Surgical History:  Procedure Laterality Date  . AORTIC VALVE REPLACEMENT    . rotator cuff Right   . THORACIC AORTIC ANEURYSM REPAIR      Social History   Socioeconomic History  . Marital status: Married    Spouse name: Not on file  . Number of children: Not on file  . Years of education: Not on file  . Highest education level: Not on file  Occupational History  . Not on file  Social Needs  . Financial resource strain: Not on file  . Food insecurity:    Worry: Not on file    Inability: Not on file  . Transportation needs:    Medical: Not on file    Non-medical: Not on file  Tobacco Use  . Smoking status: Never Smoker  . Smokeless tobacco: Never Used  Substance and Sexual Activity  . Alcohol use: No    Alcohol/week: 0.0 standard drinks  . Drug use: No  . Sexual activity: Not on file  Lifestyle  . Physical activity:    Days per week: Not on file    Minutes per session: Not on file  . Stress: Not on file  Relationships  . Social connections:    Talks on phone: Not on file  Gets together: Not on file    Attends religious service: Not on file    Active member of club or organization: Not on file    Attends meetings of clubs or organizations: Not on file    Relationship status: Not on file  . Intimate partner violence:    Fear of current or ex partner: Not on file    Emotionally abused: Not on file    Physically abused: Not on file    Forced sexual activity: Not on file  Other Topics Concern  . Not on file  Social History Narrative  . Not on file     Vitals:   09/22/18 1330  BP: (!) 126/58  Pulse: 60  SpO2: 98%  Weight: 213 lb (96.6 kg)  Height: 5\' 9"  (1.753 m)    Wt Readings from Last 3 Encounters:  09/22/18 213 lb (96.6  kg)  09/20/17 208 lb (94.3 kg)  09/03/16 210 lb (95.3 kg)     PHYSICAL EXAM General: NAD HEENT: Normal. Neck: No JVD, no thyromegaly. Lungs: Clear to auscultation bilaterally with normal respiratory effort. CV: Regular rate and rhythm, normal S1/mechanical S2 click, no S3/S4, soft I/VI systolic murmur over RUSB and LUSB. No pretibial or periankle edema.  No carotid bruit.   Abdomen: Soft, nontender, no distention.  Neurologic: Alert and oriented.  Psych: Normal affect. Skin: Normal. Musculoskeletal: No gross deformities.    ECG: Reviewed above under Subjective   Labs: Lab Results  Component Value Date/Time   K 3.9 DELTA CHECK NOTED 05/28/2009 04:45 AM   BUN 11 05/28/2009 04:45 AM   CREATININE 0.92 05/28/2009 04:45 AM   HGB 12.7 (L) 05/28/2009 04:45 AM     Lipids: No results found for: LDLCALC, LDLDIRECT, CHOL, TRIG, HDL     ASSESSMENT AND PLAN: 1. Bicuspid AV with severe aortic stenosis s/p mechanical AVR and ascending aortic aneurysm replacement: Him to medically stable.  Anticoagulated with warfarin.  Echocardiogram in October 2018 demonstrated normal prosthetic aortic valve function as detailed above.  2. Essential HTN: Controlled on present therapy which includes lisinopril and amlodipine.  No changes.  3. Hyperlipidemia: On atorvastatin 20 mg.  4.  Type 2 diabetes mellitus: Currently on metformin, atorvastatin, and lisinopril.   Disposition: Follow up 1 year   Prentice Docker, M.D., F.A.C.C.

## 2018-09-22 NOTE — Patient Instructions (Signed)
Medication Instructions:  No changes If you need a refill on your cardiac medications before your next appointment, please call your pharmacy.   Lab work: NONE If you have labs (blood work) drawn today and your tests are completely normal, you will receive your results only by: Marland Kitchen MyChart Message (if you have MyChart) OR . A paper copy in the mail If you have any lab test that is abnormal or we need to change your treatment, we will call you to review the results.  Testing/Procedures: NONE  Follow-Up:  1 year with Dr.Koneswaran at the [facility] office   Any Other Special Instructions Will Be Listed Below (If Applicable).  NONE      Thank you for choosing [facility] !

## 2019-07-23 ENCOUNTER — Telehealth: Payer: Self-pay | Admitting: Student

## 2019-07-23 NOTE — Telephone Encounter (Signed)
Virtual Visit Pre-Appointment Phone Call  "(Name), I am calling you today to discuss your upcoming appointment. We are currently trying to limit exposure to the virus that causes COVID-19 by seeing patients at home rather than in the office."  1. "What is the BEST phone number to call the day of the visit?" - include this in appointment notes  2. Do you have or have access to (through a family member/friend) a smartphone with video capability that we can use for your visit?" a. If yes - list this number in appt notes as cell (if different from BEST phone #) and list the appointment type as a VIDEO visit in appointment notes b. If no - list the appointment type as a PHONE visit in appointment notes  3. Confirm consent - "In the setting of the current Covid19 crisis, you are scheduled for a (phone or video) visit with your provider on (date) at (time).  Just as we do with many in-office visits, in order for you to participate in this visit, we must obtain consent.  If you'd like, I can send this to your mychart (if signed up) or email for you to review.  Otherwise, I can obtain your verbal consent now.  All virtual visits are billed to your insurance company just like a normal visit would be.  By agreeing to a virtual visit, we'd like you to understand that the technology does not allow for your provider to perform an examination, and thus may limit your provider's ability to fully assess your condition. If your provider identifies any concerns that need to be evaluated in person, we will make arrangements to do so.  Finally, though the technology is pretty good, we cannot assure that it will always work on either your or our end, and in the setting of a video visit, we may have to convert it to a phone-only visit.  In either situation, we cannot ensure that we have a secure connection.  Are you willing to proceed?" STAFF: Did the patient verbally acknowledge consent to telehealth visit? Document  YES/NO here: Yes  4. Advise patient to be prepared - "Two hours prior to your appointment, go ahead and check your blood pressure, pulse, oxygen saturation, and your weight (if you have the equipment to check those) and write them all down. When your visit starts, your provider will ask you for this information. If you have an Apple Watch or Kardia device, please plan to have heart rate information ready on the day of your appointment. Please have a pen and paper handy nearby the day of the visit as well."  5. Give patient instructions for MyChart download to smartphone OR [email or web address] as below if video visit (depending on what platform provider is using)  6. Inform patient they will receive a phone call 15 minutes prior to their appointment time (may be from unknown caller ID) so they should be prepared to answer    TELEPHONE CALL NOTE  Jared Rodriguez has been deemed a candidate for a follow-up tele-health visit to limit community exposure during the Covid-19 pandemic. I spoke with the patient via phone to ensure availability of phone/video source, confirm preferred email & phone number, and discuss instructions and expectations.  I reminded Jared Rodriguez to be prepared with any vital sign and/or heart rhythm information that could potentially be obtained via home monitoring, at the time of his visit. I reminded Jared Rodriguez to expect a phone call prior to  his visit.  Terry L Goins 07/23/2019 10:24 AM

## 2019-07-24 ENCOUNTER — Other Ambulatory Visit: Payer: Self-pay

## 2019-07-24 ENCOUNTER — Telehealth (INDEPENDENT_AMBULATORY_CARE_PROVIDER_SITE_OTHER): Payer: Medicare HMO | Admitting: Student

## 2019-07-24 ENCOUNTER — Encounter: Payer: Self-pay | Admitting: Student

## 2019-07-24 VITALS — BP 118/59 | HR 56 | Ht 69.0 in | Wt 213.0 lb

## 2019-07-24 DIAGNOSIS — E785 Hyperlipidemia, unspecified: Secondary | ICD-10-CM

## 2019-07-24 DIAGNOSIS — E119 Type 2 diabetes mellitus without complications: Secondary | ICD-10-CM

## 2019-07-24 DIAGNOSIS — I350 Nonrheumatic aortic (valve) stenosis: Secondary | ICD-10-CM

## 2019-07-24 DIAGNOSIS — Z952 Presence of prosthetic heart valve: Secondary | ICD-10-CM

## 2019-07-24 DIAGNOSIS — I10 Essential (primary) hypertension: Secondary | ICD-10-CM

## 2019-07-24 NOTE — Progress Notes (Signed)
Virtual Visit via Telephone Note   This visit type was conducted due to national recommendations for restrictions regarding the COVID-19 Pandemic (e.g. social distancing) in an effort to limit this patient's exposure and mitigate transmission in our community.  Due to his co-morbid illnesses, this patient is at least at moderate risk for complications without adequate follow up.  This format is felt to be most appropriate for this patient at this time.  The patient did not have access to video technology/had technical difficulties with video requiring transitioning to audio format only (telephone).  All issues noted in this document were discussed and addressed.  No physical exam could be performed with this format.  Please refer to the patient's chart for his  consent to telehealth for [facility].   Date:  07/24/2019   ID:  Isabella BowensJames R Sangster, DOB 05-16-1946, MRN [id number]  Patient Location: Home Provider Location: Office  PCP:  Kela MillinBarrino, Alethea Y, MD  Cardiologist:  Prentice DockerSuresh Koneswaran, MD  Electrophysiologist:  None   Evaluation Performed:  Follow-Up Visit  Chief Complaint: Follow-up Visit  History of Present Illness:    Isabella BowensJames R Fant is a 73 y.o. male with past medical history of bicuspid AV with severe aortic stenosis (s/p mechanical AVR in 2002 with thoracic aortic aneurysm repair), HTN, HLD, and Type 2 DM who presents for a telehealth visit for follow-up.   He was last examined by Dr. Purvis SheffieldKoneswaran in 09/2018 and denied any recent chest pain or dyspnea on exertion at that time. Was walking for up to 2 miles at times for exercise without any symptoms.  Was continued on his current medication regimen at that time including Coumadin with INR checks followed by his PCP.  In talking with the patient today, he reports overall doing well from a cardiac perspective since his last office visit and denies any recent exertional chest pain or dyspnea on exertion. He is very active at  baseline and typically walks 1 to 2 miles each day along with performing yard work and gardening. He denies any specific orthopnea, PND, lower extremity edema, or palpitations.  He does report that he fell on 07/04/2019 and landed on his chest at that time. He did not hit his head. He felt sore in his chest for several days but says this has now resolved. Symptoms were worse with coughing at that time and were not associated with exertion.  He does report two vague episodes occurring 2 to 3 days ago during which he temporarily lost his sense of smell and was slightly confused. He reports the episode lasted for less than a few minutes and spontaneously resolved. No associated headaches, vision changes, facial droop, or focal weakness at that time. He now feels back to baseline. He did not check his blood pressure at that specific time point. Blood glucose was stable at 115. No recurrent episodes since and has been feeling back to baseline.   The patient does not have symptoms concerning for COVID-19 infection (fever, chills, cough, or new shortness of breath).    Past Medical History:  Diagnosis Date  . Aortic stenosis    a. s/p mechanical AVR in 2002 with thoracic aortic aneurysm repair  . Diabetes mellitus without complication (HCC)   . Hypertension    Past Surgical History:  Procedure Laterality Date  . AORTIC VALVE REPLACEMENT    . rotator cuff Right   . THORACIC AORTIC ANEURYSM REPAIR       Current Meds  Medication Sig  .  amLODipine (NORVASC) 5 MG tablet Take 5 mg by mouth daily.  Marland Kitchen atorvastatin (LIPITOR) 20 MG tablet Take 20 mg by mouth daily.    . Doxylamine Succinate, Sleep, (SLEEP AID PO) Take 12.5 mg by mouth.  Marland Kitchen lisinopril (PRINIVIL,ZESTRIL) 30 MG tablet Take 30 mg by mouth daily.  . metFORMIN (GLUCOPHAGE) 500 MG tablet Take 500 mg by mouth daily.  Marland Kitchen warfarin (COUMADIN) 1 MG tablet Take 1 mg by mouth daily.  Marland Kitchen warfarin (COUMADIN) 5 MG tablet Take 5 mg by mouth daily.  .  [DISCONTINUED] colchicine 0.6 MG tablet Take 0.6 mg by mouth daily.  . [DISCONTINUED] tiZANidine (ZANAFLEX) 2 MG tablet Take 2 mg by mouth at bedtime.      Allergies:   Patient has no known allergies.   Social History   Tobacco Use  . Smoking status: Never Smoker  . Smokeless tobacco: Never Used  Substance Use Topics  . Alcohol use: No    Alcohol/week: 0.0 standard drinks  . Drug use: No     Family Hx: The patient's family history is not on file.  ROS:   Please see the history of present illness.     All other systems reviewed and are negative.   Prior CV studies:   The following studies were reviewed today:  Echocardiogram: 09/2017 Study Conclusions  - Left ventricle: The cavity size was normal. Wall thickness was   normal. Systolic function was normal. The estimated ejection   fraction was in the range of 50% to 55%. Wall motion was normal;   there were no regional wall motion abnormalities. Doppler   parameters are consistent with abnormal left ventricular   relaxation (grade 1 diastolic dysfunction). Indeterminate filling   pressures. - Aortic valve: Mechanical aortic prosthesis present. It appears to   be functioning normally. There was no stenosis. There was trivial   regurgitation. - Mitral valve: Calcified annulus. There was mild regurgitation. - Left atrium: The atrium was mildly to moderately dilated.   Labs/Other Tests and Data Reviewed:    EKG:  No ECG reviewed.  Recent Labs: No results found for requested labs within last 8760 hours.   Recent Lipid Panel No results found for: CHOL, TRIG, HDL, CHOLHDL, LDLCALC, LDLDIRECT  Wt Readings from Last 3 Encounters:  07/24/19 213 lb (96.6 kg)  09/22/18 213 lb (96.6 kg)  09/20/17 208 lb (94.3 kg)     Objective:    Vital Signs:  BP (!) 118/59   Pulse (!) 56   Ht 5\' 9"  (1.753 m)   Wt 213 lb (96.6 kg)   BMI 31.45 kg/m    General: Pleasant male sounding in NAD Psych: Normal affect. Neuro: Alert  and oriented X 3 Lungs:  Resp regular and unlabored while talking on the phone.   ASSESSMENT & PLAN:    1. Aortic Valve Replacement - he is s/p mechanical AVR in 2002 with thoracic aortic aneurysm repair. He denies any recent exertional chest pain or dyspnea. He does report vague episodes of loss of smell and intermittent confusion and is concerned as he had fallen on his chest a few weeks prior. I do not suspect this caused any issues and reviewed that with him today. Most recent echo two years ago showed normal valve function. Will obtain repeat echo for reassessment and reassurance. His episodes of confusion sound most concerning for a neurological event as compared to a cardiac etiology. I reviewed with the patient and his wife warning signs to monitor for if recurrent  events as this could have possibly represented a TIA. They will monitor for further symptoms.   2. HTN - BP has been well-controlled, at 118/59 on most recent check. Continue Amlodipine 5mg  daily and Lisinopril 30mg  daily.   3. HLD - FLP in 04/2018 showed total cholesterol 131, triglycerides 115, HDL 43 and LDL 65. Continue current regimen with Atorvastatin 20mg  daily.   4. Type 2 DM - Followed by PCP. Hemoglobin A1c 5.8 in 09/2018 by review of Care Everywhere. Remains on Metformin.    COVID-19 Education: The signs and symptoms of COVID-19 were discussed with the patient and how to seek care for testing (follow up with PCP or arrange E-visit).  The importance of social distancing was discussed today.  Time:   Today, I have spent 24 minutes with the patient with telehealth technology discussing the above problems.     Medication Adjustments/Labs and Tests Ordered: Current medicines are reviewed at length with the patient today.  Concerns regarding medicines are outlined above.   Tests Ordered: Orders Placed This Encounter  Procedures  . ECHOCARDIOGRAM COMPLETE    Medication Changes: No orders of the defined types  were placed in this encounter.   Follow Up:  In Person in 6 month(s)  Signed, Ellsworth LennoxBrittany M Aleiyah Halpin, PA-C  07/24/2019 8:18 PM    [facility]

## 2019-07-24 NOTE — Patient Instructions (Signed)
Medication Instructions:  .instcur   Labwork: none  Testing/Procedures: Your physician has requested that you have an echocardiogram. Echocardiography is a painless test that uses sound waves to create images of your heart. It provides your doctor with information about the size and shape of your heart and how well your heart's chambers and valves are working. This procedure takes approximately one hour. There are no restrictions for this procedure.    Follow-Up: Your physician wants you to follow-up in: 6 months.  You will receive a reminder letter in the mail two months in advance. If you don't receive a letter, please call our office to schedule the follow-up appointment.   Any Other Special Instructions Will Be Listed Below (If Applicable).     If you need a refill on your cardiac medications before your next appointment, please call your pharmacy.

## 2019-07-27 ENCOUNTER — Ambulatory Visit (HOSPITAL_COMMUNITY)
Admission: RE | Admit: 2019-07-27 | Discharge: 2019-07-27 | Disposition: A | Discharge status: Home / Self Care | Payer: Medicare HMO | Source: Ambulatory Visit | Attending: Student | Admitting: Student

## 2019-07-27 ENCOUNTER — Other Ambulatory Visit: Payer: Self-pay

## 2019-07-27 DIAGNOSIS — Z952 Presence of prosthetic heart valve: Secondary | ICD-10-CM

## 2019-07-27 DIAGNOSIS — I350 Nonrheumatic aortic (valve) stenosis: Secondary | ICD-10-CM | POA: Insufficient documentation

## 2019-07-27 NOTE — Progress Notes (Signed)
*  PRELIMINARY RESULTS* Echocardiogram 2D Echocardiogram has been performed.  Samuel Germany 07/27/2019, 1:57 PM

## 2019-07-29 ENCOUNTER — Telehealth: Payer: Self-pay | Admitting: Cardiovascular Disease

## 2019-07-29 NOTE — Telephone Encounter (Signed)
Calling for Echo results   [phone]

## 2019-07-29 NOTE — Telephone Encounter (Signed)
Pt made aware. Voiced understanding.  

## 2020-05-13 ENCOUNTER — Telehealth: Payer: Self-pay | Admitting: *Deleted

## 2020-05-13 NOTE — Telephone Encounter (Addendum)
    Given his mechanical aortic valve, his target INR should be 2.5 with a range of 2.0 to 3.0. I spoke to the patient's provider and made him aware of the recommendations.   Signed, Ellsworth Lennox, PA-C 05/13/2020, 12:48 PM Pager: [phone]

## 2020-05-13 NOTE — Telephone Encounter (Signed)
Spoke with Joycelyn Das with Urgent care. Stated he has been managing his warfarin. Pt has st jude Mechanical valve and INR has been at 2. Ander Slade, NP would like to know it cardiology would like pt's INR closer to 2.5? He can be reached at [phone].

## 2020-09-11 ENCOUNTER — Encounter: Payer: Self-pay | Admitting: Cardiology

## 2020-09-11 NOTE — Progress Notes (Signed)
Cardiology Office Note  Date: 09/12/2020   ID: Jared Rodriguez, DOB [birth year], MRN [id number]  PCP:  Suzan Slick, MD  Cardiologist:  Nona Dell, MD Electrophysiologist:  None   Chief Complaint  Patient presents with  . Cardiac follow-up    History of Present Illness: Jared Rodriguez is a 74 y.o. male former patient of Dr. Purvis Sheffield now presenting to establish follow-up with me.  I reviewed his records and updated the chart.  He was last assessed in August 2020 via telehealth encounter by Ms. Strader PA-C.  He states that he tries to walk a mile a day, stays active with yard work.  Reports NYHA class II dyspnea, no chest pain or sense of palpitations.  He continues on Coumadin with follow-up by PCP.  Reports no spontaneous bleeding problems, has had some bruising.  Last INR was 3.5.  Echocardiogram from August 2020 revealed LVEF 55 to 60%, mild mitral regurgitation, and normally functioning aortic mechanical prosthesis with mean gradient 21 mmHg.   I reviewed his medications which are noted below.  I personally reviewed his ECG today which shows sinus rhythm with prolonged PR interval, PACs and PVCs, IVCD with poor R wave progression.  Past Medical History:  Diagnosis Date  . Aortic stenosis 2002   Bicuspid aortic valve with associated aortic aneurysm status post 25 mm St. Jude mechanical AVR and replacement of the aortic root  . Essential hypertension   . Type 2 diabetes mellitus (HCC)     Past Surgical History:  Procedure Laterality Date  . AORTIC VALVE REPLACEMENT  2002  . ROTATOR CUFF REPAIR Right   . THORACIC AORTIC ANEURYSM REPAIR  2002    Current Outpatient Medications  Medication Sig Dispense Refill  . amLODipine (NORVASC) 5 MG tablet Take 5 mg by mouth daily.    Marland Kitchen atorvastatin (LIPITOR) 20 MG tablet Take 20 mg by mouth daily.      . Doxylamine Succinate, Sleep, (SLEEP AID PO) Take 12.5 mg by mouth.    Marland Kitchen lisinopril (PRINIVIL,ZESTRIL) 30 MG  tablet Take 30 mg by mouth daily.    . metFORMIN (GLUCOPHAGE) 500 MG tablet Take 500 mg by mouth daily.    Marland Kitchen warfarin (COUMADIN) 1 MG tablet Take 1 mg by mouth daily.    Marland Kitchen warfarin (COUMADIN) 5 MG tablet Take 5 mg by mouth daily.     No current facility-administered medications for this visit.   Allergies:  Patient has no known allergies.   ROS:  No palpitations or syncope.  Physical Exam: VS:  BP 124/62   Pulse 66   Ht 5\' 8"  (1.727 m)   Wt 221 lb (100.2 kg)   SpO2 97%   BMI 33.60 kg/m , BMI Body mass index is 33.6 kg/m.  Wt Readings from Last 3 Encounters:  09/12/20 221 lb (100.2 kg)  07/24/19 213 lb (96.6 kg)  09/22/18 213 lb (96.6 kg)    General: Patient appears comfortable at rest. HEENT: Conjunctiva and lids normal, wearing a mask. Neck: Supple, no elevated JVP or carotid bruits, no thyromegaly. Lungs: Clear to auscultation, nonlabored breathing at rest. Cardiac: Regular rate and rhythm with ectopy, no S3, 2/6 systolic murmur with crisp prosthetic click and S2, no pericardial rub. Abdomen: Soft, nontender, bowel sounds present. Extremities: No pitting edema, distal pulses 2+. Skin: Warm and dry. Musculoskeletal: No kyphosis. Neuropsychiatric: Alert and oriented x3, affect grossly appropriate.  ECG:  An ECG dated 09/22/2018 was personally reviewed today and demonstrated:  Sinus  bradycardia with prolonged PR interval.  Recent Labwork:  December 2020: Hemoglobin 14.2, platelets 248, potassium 4.8, BUN 10, creatinine 0.85, AST 30, ALT 25, triglycerides 92, HDL 40, LDL 69, cholesterol 127 April 2021: Hemoglobin A1c 6.6%  Other Studies Reviewed Today:  Echocardiogram 07/27/2019: 1. The left ventricle has normal systolic function, with an ejection  fraction of 55-60%. The cavity size was normal. There is mild concentric  left ventricular hypertrophy. Left ventricular diastolic Doppler  parameters are consistent with impaired  relaxation. Indeterminate filling  pressures.  2. The right ventricle has normal systolic function. The cavity was  mildly enlarged. There is no increase in right ventricular wall thickness.  3. The mitral valve is degenerative. Mild thickening of the mitral valve  leaflet. There is mild mitral annular calcification present.  4. Normally functioning mechanical aortic prosthesis.  5. The aorta is normal unless otherwise noted.   Assessment and Plan:  1.  History of bicuspid aortic valve with associated aortic root aneurysm status post mechanical AVR with root replacement in 2002.  He remains clinically stable, follow-up echocardiogram in August 2020 showed normal LVEF at 55 to 60% and normal functioning mechanical aortic prosthesis.  Continue Coumadin with follow-up per PCP.  Current guidelines would indicate therapeutic INR 2.0-3.0 range.  2.  Essential hypertension, blood pressure is well controlled today.  He continues on Norvasc and lisinopril.  3.  Mixed hyperlipidemia, continues on Lipitor, last LDL 69.  Medication Adjustments/Labs and Tests Ordered: Current medicines are reviewed at length with the patient today.  Concerns regarding medicines are outlined above.   Tests Ordered: Orders Placed This Encounter  Procedures  . EKG 12-Lead    Medication Changes: No orders of the defined types were placed in this encounter.   Disposition:  Follow up 1 year in the [place] office.  Signed, Jonelle Sidle, MD, [facility] 09/12/2020 10:35 AM    [facility] at [facility] [address] [place], [place], [place] [address] Phone: [phone]; Fax: [phone]

## 2020-09-12 ENCOUNTER — Ambulatory Visit: Payer: Medicare HMO | Admitting: Cardiology

## 2020-09-12 ENCOUNTER — Encounter: Payer: Self-pay | Admitting: Cardiology

## 2020-09-12 VITALS — BP 124/62 | HR 66 | Ht 68.0 in | Wt 221.0 lb

## 2020-09-12 DIAGNOSIS — I10 Essential (primary) hypertension: Secondary | ICD-10-CM | POA: Diagnosis not present

## 2020-09-12 DIAGNOSIS — E782 Mixed hyperlipidemia: Secondary | ICD-10-CM

## 2020-09-12 DIAGNOSIS — Z954 Presence of other heart-valve replacement: Secondary | ICD-10-CM | POA: Diagnosis not present

## 2020-09-12 DIAGNOSIS — Z23 Encounter for immunization: Secondary | ICD-10-CM | POA: Diagnosis not present

## 2020-09-12 NOTE — Patient Instructions (Signed)

## 2022-01-04 ENCOUNTER — Encounter: Payer: Self-pay | Admitting: Cardiology

## 2022-01-04 ENCOUNTER — Ambulatory Visit: Payer: Medicare HMO | Admitting: Cardiology

## 2022-01-04 VITALS — BP 158/76 | HR 62 | Ht 69.0 in | Wt 217.0 lb

## 2022-01-04 DIAGNOSIS — I10 Essential (primary) hypertension: Secondary | ICD-10-CM | POA: Diagnosis not present

## 2022-01-04 DIAGNOSIS — E782 Mixed hyperlipidemia: Secondary | ICD-10-CM | POA: Diagnosis not present

## 2022-01-04 DIAGNOSIS — Z954 Presence of other heart-valve replacement: Secondary | ICD-10-CM

## 2022-01-04 NOTE — Progress Notes (Signed)
Cardiology Office Note  Date: 01/04/2022   ID: Victorhugo, Preis [birth year], MRN [id number]  PCP:  Suzan Slick, MD  Cardiologist:  Nona Dell, MD Electrophysiologist:  None   Chief Complaint  Patient presents with   Cardiac follow-up    History of Present Illness: Jared Rodriguez is a 76 y.o. male last seen in October 2021.  He is here for a follow-up visit.  He does not report any change in stamina.  Describes NYHA class II dyspnea with typical activities, no angina or palpitations.  He remains on Coumadin with follow-up by PCP.  He does not report any spontaneous bleeding problems.  Recent INR was therapeutic. I reviewed his recent lab work as noted below.  Personally reviewed his ECG today which shows sinus rhythm with prolonged PR interval and occasional PACs, QRS duration 130 ms.  His last follow-up echocardiogram was in August 2020 at which point his aortic valve prosthesis was functioning normally, mean gradient 21 mmHg.  Past Medical History:  Diagnosis Date   Aortic stenosis 2002   Bicuspid aortic valve with associated aortic aneurysm status post 25 mm St. Jude mechanical AVR and replacement of the aortic root   Essential hypertension    Type 2 diabetes mellitus (HCC)     Past Surgical History:  Procedure Laterality Date   AORTIC VALVE REPLACEMENT  2002   ROTATOR CUFF REPAIR Right    THORACIC AORTIC ANEURYSM REPAIR  2002    Current Outpatient Medications  Medication Sig Dispense Refill   amLODipine (NORVASC) 5 MG tablet Take 5 mg by mouth daily.     atorvastatin (LIPITOR) 20 MG tablet Take 20 mg by mouth daily.       Doxylamine Succinate, Sleep, (SLEEP AID PO) Take 12.5 mg by mouth.     lisinopril (PRINIVIL,ZESTRIL) 30 MG tablet Take 30 mg by mouth daily.     metFORMIN (GLUCOPHAGE) 500 MG tablet Take 500 mg by mouth 2 (two) times daily.     warfarin (COUMADIN) 1 MG tablet Take 1 mg by mouth daily.     warfarin (COUMADIN) 5 MG tablet Take 5 mg  by mouth daily.     No current facility-administered medications for this visit.   Allergies:  Patient has no known allergies.   ROS: No orthopnea or PND.  Left heel pain, currently using a support boot.  Physical Exam: VS:  BP (!) 158/76    Pulse 62    Ht 5\' 9"  (1.753 m)    Wt 217 lb (98.4 kg)    SpO2 98%    BMI 32.05 kg/m , BMI Body mass index is 32.05 kg/m.  Wt Readings from Last 3 Encounters:  01/04/22 217 lb (98.4 kg)  09/12/20 221 lb (100.2 kg)  07/24/19 213 lb (96.6 kg)    General: Patient appears comfortable at rest. HEENT: Conjunctiva and lids normal, wearing a mask. Neck: Supple, no elevated JVP or carotid bruits, no thyromegaly. Lungs: Clear to auscultation, nonlabored breathing at rest. Cardiac: Regular rate and rhythm, no S3, 2/6 systolic murmur with mechanical prosthetic click in S2, no pericardial rub. Abdomen: Soft, bowel sounds present. Extremities: Support boot on left foot.  ECG:  An ECG dated 09/12/2020 was personally reviewed today and demonstrated:  Sinus rhythm with prolonged PR interval, PACs and PVCs, IVCD and poor R wave progression.  Recent Labwork:  January 2023: INR 2.9, hemoglobin A1c 9.8%, hemoglobin 13.7, platelets 269, potassium 4.2, BUN 12, creatinine 0.88, AST 39, ALT 35,  cholesterol 132, triglycerides 149, HDL 43, LDL 63  Other Studies Reviewed Today:  Echocardiogram 07/27/2019:  1. The left ventricle has normal systolic function, with an ejection  fraction of 55-60%. The cavity size was normal. There is mild concentric  left ventricular hypertrophy. Left ventricular diastolic Doppler  parameters are consistent with impaired  relaxation. Indeterminate filling pressures.   2. The right ventricle has normal systolic function. The cavity was  mildly enlarged. There is no increase in right ventricular wall thickness.   3. The mitral valve is degenerative. Mild thickening of the mitral valve  leaflet. There is mild mitral annular calcification  present.   4. Normally functioning mechanical aortic prosthesis.   5. The aorta is normal unless otherwise noted.   Assessment and Plan:  1.  History of bicuspid aortic valve with aortic root aneurysm status post mechanical AVR and root replacement in 2002.  St. Jude prosthesis in place with grossly normal function by echocardiogram in 2020, no significant change in cardiac murmur.  He continues on Coumadin with follow-up by PCP, recent INR therapeutic at 2.9.  No reported spontaneous bleeding problems.  Continue observation for now.  ECG reviewed.  2.  Mixed hyperlipidemia, he remains on Lipitor with recent LDL of 63.  3.  Essential hypertension, blood pressure elevated today.  He reports compliance with Norvasc and lisinopril.  Would keep follow-up with PCP in case further adjustments are necessary.  Medication Adjustments/Labs and Tests Ordered: Current medicines are reviewed at length with the patient today.  Concerns regarding medicines are outlined above.   Tests Ordered: Orders Placed This Encounter  Procedures   EKG 12-Lead    Medication Changes: No orders of the defined types were placed in this encounter.   Disposition:  Follow up  1 year.  Signed, Jonelle Sidle, MD, [facility] 01/04/2022 11:23 AM    [facility] at [facility] [address] [place], [place], [place] [address] Phone: [phone]; Fax: [phone]

## 2022-01-04 NOTE — Patient Instructions (Addendum)

## 2023-04-19 ENCOUNTER — Ambulatory Visit: Payer: Medicare HMO | Attending: Cardiology | Admitting: Cardiology

## 2023-04-19 ENCOUNTER — Encounter: Payer: Self-pay | Admitting: Cardiology

## 2023-04-19 VITALS — BP 140/62 | HR 61 | Ht 69.0 in | Wt 212.2 lb

## 2023-04-19 DIAGNOSIS — Z954 Presence of other heart-valve replacement: Secondary | ICD-10-CM

## 2023-04-19 DIAGNOSIS — I10 Essential (primary) hypertension: Secondary | ICD-10-CM | POA: Diagnosis not present

## 2023-04-19 DIAGNOSIS — E782 Mixed hyperlipidemia: Secondary | ICD-10-CM

## 2023-04-19 NOTE — Patient Instructions (Addendum)
Medication Instructions:  Your physician recommends that you continue on your current medications as directed. Please refer to the Current Medication list given to you today.  Labwork: none  Testing/Procedures: Your physician has requested that you have an echocardiogram in 1 year just before next visit. Echocardiography is a painless test that uses sound waves to create images of your heart. It provides your doctor with information about the size and shape of your heart and how well your heart's chambers and valves are working. This procedure takes approximately one hour. There are no restrictions for this procedure. Please do NOT wear cologne, perfume, aftershave, or lotions (deodorant is allowed). Please arrive 15 minutes prior to your appointment time.  Follow-Up: Your physician recommends that you schedule a follow-up appointment in: 1 year. You will receive a reminder call in the mail in about 10 months reminding you to call and schedule your appointment. If you don't receive this call, please contact our office.  Any Other Special Instructions Will Be Listed Below (If Applicable).  If you need a refill on your cardiac medications before your next appointment, please call your pharmacy.

## 2023-04-19 NOTE — Progress Notes (Signed)
    Cardiology Office Note  Date: 04/19/2023   ID: JAHCERE DERR, DOB [birth year], MRN [id number]  History of Present Illness: Jared Rodriguez is a 77 y.o. male last seen in February 2023.  He is here for a follow-up visit.  Reports no change in stamina, no angina, stable NYHA class II dyspnea.  He tries to walk a mile a day for exercise.  No palpitations or syncope.  He is on Coumadin with follow-up by PCP.  INR was 2.0 in April.  He reports no spontaneous bleeding problems.  I reviewed the remainder of his medications, he reports compliance with therapy.  States that he will have a physical this year with lab work.  ECG today shows sinus rhythm with prolonged PR interval, PAC and PVC.  His last echocardiogram was in 2020, we will plan on an updated study for his visit in 1 year.  Physical Exam: VS:  BP (!) 140/62   Pulse 61   Ht 5\' 9"  (1.753 m)   Wt 212 lb 3.2 oz (96.3 kg)   SpO2 97%   BMI 31.34 kg/m , BMI Body mass index is 31.34 kg/m.  Wt Readings from Last 3 Encounters:  04/19/23 212 lb 3.2 oz (96.3 kg)  01/04/22 217 lb (98.4 kg)  09/12/20 221 lb (100.2 kg)    General: Patient appears comfortable at rest. HEENT: Conjunctiva and lids normal. Neck: Supple, no elevated JVP or carotid bruits. Lungs: Clear to auscultation, nonlabored breathing at rest. Cardiac: Regular rate and rhythm with occasional ectopic beats, no S3, crisp prosthetic sound and S2 with 2/6 systolic murmur, no gallop. Extremities: No pitting edema.  ECG:  An ECG dated 01/04/2022 was personally reviewed today and demonstrated:  Sinus rhythm with prolonged PR interval and occasional PACs, QRS duration 130 ms.  Labwork:  February 2024: Hemoglobin 14.0, platelets 266, potassium 4.6, BUN 16, creatinine 0.99, hemoglobin A1c 7.1%  Other Studies Reviewed Today:  No interval cardiac testing for review today.  Assessment and Plan:  1.  History of bicuspid aortic valve with severe aortic stenosis status  post AVR with 25 mm St. Jude mechanical prosthesis and replacement of aortic root in 2002.  Echocardiogram in 2020 revealed LVEF 55 to 60% with AV mean gradient 20.5 mmHg and trivial aortic regurgitation.  He remains symptomatically stable with NYHA class II dyspnea.  Continue Coumadin with follow-up by PCP.  We will update his echocardiogram next year.  2.  Essential hypertension.  Continue Norvasc and lisinopril.  Keep follow-up with PCP.  3.  Mixed hyperlipidemia.  He remains on Lipitor, due for follow-up lipids with PCP this year.  Disposition:  Follow up  1 year.  Signed, Jonelle Sidle, M.D., F.A.C.C. [facility] at [facility]

## 2023-05-27 ENCOUNTER — Telehealth: Payer: Self-pay

## 2023-05-27 NOTE — Telephone Encounter (Signed)
   Pre-operative Risk Assessment    Patient Name: Jared Rodriguez  DOB: [birth year] MRN: [id number]     Request for Surgical Clearance    Procedure:  Dental Extraction - Amount of Teeth to be Pulled:  SURGICAL EXTRACTION OF LOWER MOLAR TOOTH  Date of Surgery:  Clearance TBD                                 Surgeon:  DR. Gery Pray Surgeon's Group or Practice Name:  [facility] Phone number:  [phone] Fax number:  [phone]   Type of Clearance Requested:   - Pharmacy:  Hold Warfarin (Coumadin) NEEDS INSTRUCTIONS    Type of Anesthesia:  Not Indicated   Additional requests/questions:    SignedMichaelle Copas   05/27/2023, 5:03 PM

## 2023-05-28 MED ORDER — AMOXICILLIN 500 MG PO CAPS: ORAL_CAPSULE | ORAL | 3 refills | Status: AC

## 2023-05-28 NOTE — Telephone Encounter (Signed)
Patient's wife is calling stating this clearance hasn't been received from the office and she would like to know if this could be sent again.

## 2023-05-28 NOTE — Telephone Encounter (Signed)
Patient with diagnosis of mechanical AVR on warfarin for anticoagulation. INRs are followed by PCP office.  Procedure: surgical extraction of molar Date of procedure: TBD  CrCl 67mL/min using adjusted body weight Platelet count 266K  Patient does require pre-op antibiotics for dental procedure. Please let pt know I have sent in rx for amoxicillin to his pharmacy.  Shouldn't need a full 5 day warfarin hold for single extraction. Ok for pt to hold warfarin for 3 days prior to procedure. He does not require periprocedural bridging.  **This guidance is not considered finalized until pre-operative APP has relayed final recommendations.**

## 2023-05-28 NOTE — Telephone Encounter (Signed)
     Primary Cardiologist: Nona Dell, MD  Chart reviewed as part of pre-operative protocol coverage. Given past medical history and time since last visit, based on ACC/AHA guidelines, KAVAN DEVAN would be at acceptable risk for the planned procedure without further cardiovascular testing.   Patient with diagnosis of mechanical AVR on warfarin for anticoagulation. INRs are followed by PCP office.   Procedure: surgical extraction of molar Date of procedure: TBD   CrCl 23mL/min using adjusted body weight Platelet count 266K   Patient does require pre-op antibiotics for dental procedure. Please let pt know I have sent in rx for amoxicillin to his pharmacy.   Shouldn't need a full 5 day warfarin hold for single extraction. Ok for pt to hold warfarin for 3 days prior to procedure. He does not require periprocedural bridging.  I will route this recommendation to the requesting party via Epic fax function and remove from pre-op pool.  Please call with questions. Thomasene Ripple. Tristyn Pharris NP-C     05/28/2023, 10:46 AM [facility] [address] Office [phone] Fax [phone]

## 2023-12-13 ENCOUNTER — Other Ambulatory Visit: Payer: Self-pay | Admitting: *Deleted

## 2023-12-13 DIAGNOSIS — Z954 Presence of other heart-valve replacement: Secondary | ICD-10-CM

## 2024-04-09 ENCOUNTER — Ambulatory Visit: Payer: Medicare HMO | Attending: Cardiology

## 2024-04-09 DIAGNOSIS — Z954 Presence of other heart-valve replacement: Secondary | ICD-10-CM

## 2024-04-13 LAB — ECHOCARDIOGRAM COMPLETE
AV Mean grad: 15.3 mmHg
AV Peak grad: 28.1 mmHg
Ao pk vel: 2.65 m/s
Calc EF: 40.1 %
S' Lateral: 3.8 cm
Single Plane A2C EF: 35.1 %
Single Plane A4C EF: 42.3 %

## 2024-04-15 ENCOUNTER — Encounter: Payer: Self-pay | Admitting: Cardiology

## 2024-04-15 ENCOUNTER — Ambulatory Visit: Payer: Medicare HMO | Attending: Cardiology | Admitting: Cardiology

## 2024-04-15 VITALS — BP 132/72 | HR 65 | Ht 69.0 in | Wt 211.4 lb

## 2024-04-15 DIAGNOSIS — E782 Mixed hyperlipidemia: Secondary | ICD-10-CM

## 2024-04-15 DIAGNOSIS — I4819 Other persistent atrial fibrillation: Secondary | ICD-10-CM

## 2024-04-15 DIAGNOSIS — I340 Nonrheumatic mitral (valve) insufficiency: Secondary | ICD-10-CM

## 2024-04-15 DIAGNOSIS — I480 Paroxysmal atrial fibrillation: Secondary | ICD-10-CM

## 2024-04-15 DIAGNOSIS — Z954 Presence of other heart-valve replacement: Secondary | ICD-10-CM

## 2024-04-15 DIAGNOSIS — I10 Essential (primary) hypertension: Secondary | ICD-10-CM | POA: Diagnosis not present

## 2024-04-15 NOTE — Progress Notes (Signed)
 Cardiology Office Note  Date: 04/15/2024   ID: Jared Rodriguez, DOB 02/04/1946, MRN [id number]  History of Present Illness: Jared Rodriguez is a 78 y.o. male last seen in May 2024.  He is here today with his wife for a follow-up visit.  He does not report any exertional chest pain with typical activities, stable NYHA class II dyspnea.  No sense of palpitations and no syncope.  He remains on Coumadin with follow-up by PCP.  Recent INR 2.82.  He does not report any spontaneous bleeding problems.  We went over the remainder of his medications, he reports compliance with therapy.  I reviewed his recent echocardiogram images, LVEF closer to the range of 50 to 55%, mitral regurgitation is moderate, and prosthetic aortic valve function is normal with mean AV gradient approximately 15 mmHg.  We discussed this today.  I reviewed his ECG today which shows atrial fibrillation with controlled ventricular response and PVCs.  Arrhythmia is newly documented, he was in sinus rhythm last year.  Heart rate is controlled and he is already on anticoagulation.  No clear indication for cardioversion at this time.  Physical Exam: VS:  BP 132/72   Pulse 65   Ht 5\' 9"  (1.753 m)   Wt 211 lb 6.4 oz (95.9 kg)   SpO2 100%   BMI 31.22 kg/m , BMI Body mass index is 31.22 kg/m.  Wt Readings from Last 3 Encounters:  04/15/24 211 lb 6.4 oz (95.9 kg)  04/19/23 212 lb 3.2 oz (96.3 kg)  01/04/22 217 lb (98.4 kg)    General: Patient appears comfortable at rest. HEENT: Conjunctiva and lids normal. Neck: Supple, no elevated JVP or carotid bruits. Lungs: Clear to auscultation, nonlabored breathing at rest. Cardiac: Irregularly irregular, crisp mechanical click in S2 with 2/6 systolic murmur.. Extremities: No pitting edema.  ECG:  An ECG dated 04/19/2023 was personally reviewed today and demonstrated:  Sinus rhythm with prolonged PR interval, PAC and PVC.  Labwork:  April 2025: Hemoglobin 13.3, platelets 262,  cholesterol 122, triglycerides 141, HDL 44, LDL 50, TSH 4.109, potassium 4.7, BUN 13, creatinine 1.05, GFR 73, AST 19, ALT 33, hemoglobin A1c 7.5%  Other Studies Reviewed Today:  Echocardiogram 04/09/2024:  1. Left ventricular ejection fraction, by estimation, is 40 to 45%. The  left ventricle has mildly decreased function. The left ventricle  demonstrates global hypokinesis. There is mild left ventricular  hypertrophy. Left ventricular diastolic parameters  are indeterminate.   2. Right ventricular systolic function is normal. The right ventricular  size is normal. Tricuspid regurgitation signal is inadequate for assessing  PA pressure.   3. Left atrial size was mildly dilated.   4. Right atrial size was mildly dilated.   5. The mitral valve is abnormal. Mild to moderate mitral valve  regurgitation. No evidence of mitral stenosis.   6. The tricuspid valve is abnormal.   7. The aortic valve has been repaired/replaced. Aortic valve  regurgitation is not visualized. No aortic stenosis is present. There is a  25 mm St. Jude mechanical valve present in the aortic position. Procedure  Date: 10/20/2021. Aortic valve mean  gradient measures 15.3 mmHg.   8. The inferior vena cava is dilated in size with >50% respiratory  variability, suggesting right atrial pressure of 8 mmHg.   Assessment and Plan:  1.  History of bicuspid aortic valve with severe aortic stenosis status post AVR with 25 mm St. Jude mechanical prosthesis and replacement of aortic root in 2002.  Echocardiogram in May shows LVEF 50 to 55% by my review with normal prosthetic aortic valve function, mean AV gradient 15.3 mmHg and no aortic regurgitation.  Continue Coumadin with follow-up by PCP.  2.  Newly documented atrial fibrillation, asymptomatic and most likely persistent.  CHA2DS2-VASc score is 4.  He is already on Coumadin with mechanical AVR, this will provide stroke prophylaxis as well.  Heart rate controlled in the absence  of AV nodal blockers.  No clear indication for cardioversion.   3.  Primary hypertension.  Continue lisinopril 30 mg daily and Norvasc 5 mg daily.   4.  Mixed hyperlipidemia.  Continue Lipitor 20 mg daily.  LDL 50 in April.  5.  Mitral regurgitation, moderate by most recent echocardiogram.  Continue to follow.  Disposition:  Follow up 6 months.  Signed, Gerard Knight, M.D., F.A.C.C. [facility] at [facility]

## 2024-04-15 NOTE — Patient Instructions (Signed)

## 2024-04-17 ENCOUNTER — Ambulatory Visit: Payer: Self-pay | Admitting: *Deleted

## 2024-04-17 NOTE — Telephone Encounter (Signed)
 Pt returning nurse call in regards to results.

## 2024-10-19 ENCOUNTER — Ambulatory Visit: Attending: Cardiology | Admitting: Cardiology

## 2024-10-19 ENCOUNTER — Other Ambulatory Visit: Payer: Self-pay | Admitting: Cardiology

## 2024-10-19 ENCOUNTER — Encounter: Payer: Self-pay | Admitting: Cardiology

## 2024-10-19 ENCOUNTER — Ambulatory Visit: Attending: Cardiology

## 2024-10-19 ENCOUNTER — Telehealth: Payer: Self-pay | Admitting: Cardiology

## 2024-10-19 VITALS — BP 124/56 | HR 66 | Ht 69.0 in | Wt 209.0 lb

## 2024-10-19 DIAGNOSIS — R001 Bradycardia, unspecified: Secondary | ICD-10-CM

## 2024-10-19 DIAGNOSIS — I340 Nonrheumatic mitral (valve) insufficiency: Secondary | ICD-10-CM | POA: Diagnosis not present

## 2024-10-19 DIAGNOSIS — I4891 Unspecified atrial fibrillation: Secondary | ICD-10-CM

## 2024-10-19 DIAGNOSIS — Z954 Presence of other heart-valve replacement: Secondary | ICD-10-CM | POA: Diagnosis not present

## 2024-10-19 DIAGNOSIS — I4819 Other persistent atrial fibrillation: Secondary | ICD-10-CM

## 2024-10-19 DIAGNOSIS — E782 Mixed hyperlipidemia: Secondary | ICD-10-CM | POA: Diagnosis not present

## 2024-10-19 NOTE — Progress Notes (Signed)
    Cardiology Office Note  Date: 10/19/2024   ID: Jared Rodriguez, DOB 1946/03/28, MRN [id number]  History of Present Illness: Jared Rodriguez is a 78 y.o. male last seen in May.  He is here today with his wife for a follow-up visit.  He does not report any angina, no sense of palpitations.  Has chronic dyspnea on exertion that is generally NYHA class II.  No unusual weight gain or fluid retention.  He is following with PCP through the [facility] system.  I reviewed interval records and lab work.  Some concerns for bradycardia based on heart rate checks at office visit (38 in September) although apparently not confirmed by ECG.  He is not on any AV nodal blockers.  He is on Coumadin with follow-up by PCP.  Antihypertensive regimen has been adjusted.  Physical Exam: VS:  BP (!) 124/56 (BP Location: Left Arm, Patient Position: Sitting, Cuff Size: Normal)   Pulse 66   Ht 5' 9 (1.753 m)   Wt 209 lb (94.8 kg)   SpO2 97%   BMI 30.86 kg/m , BMI Body mass index is 30.86 kg/m.  Wt Readings from Last 3 Encounters:  10/19/24 209 lb (94.8 kg)  04/15/24 211 lb 6.4 oz (95.9 kg)  04/19/23 212 lb 3.2 oz (96.3 kg)    General: Patient appears comfortable at rest. HEENT: Conjunctiva and lids normal. Neck: Supple, no elevated JVP or carotid bruits. Lungs: Clear to auscultation, nonlabored breathing at rest. Cardiac: Irregularly irregular, 2/6 systolic murmur without gallop. Extremities: No pitting edema.  ECG:  An ECG dated 04/15/2024 was personally reviewed today and demonstrated:  Atrial fibrillation with controlled ventricular response and PVCs.  Labwork:  April 2025: Hemoglobin 13.3, platelets 262, cholesterol 122, triglycerides 141, HDL 44, LDL 50, TSH 4.109 August 2025: Hemoglobin A1c 7.8%  Other Studies Reviewed Today:  No interval cardiac testing for review today.  Assessment and Plan:  1.  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  Echocardiogram in May shows LVEF 50 to 55% by my review with normal prosthetic aortic valve function, mean AV gradient 15.3 mmHg and no aortic regurgitation.  Continue Coumadin with follow-up by PCP.  Recheck echocardiogram in May 2026 with clinical follow-up.   2.  Persistent atrial fibrillation.  CHA2DS2-VASc score is 4.  He is already on Coumadin with mechanical AVR, this will provide stroke prophylaxis as well.  Heart rate in the 60s today.  We will plan a 72-hour ZIO monitor to better assess true heart rate variability given interval concerns for bradycardia.   3.  Primary hypertension.  Blood pressure well-controlled today.  Now on Azor 5/40 mg daily per adjustment by PCP.   4.  Mixed hyperlipidemia.  Continue Lipitor 20 mg daily.  LDL 50 in April..   5.  Mitral regurgitation, moderate by most recent echocardiogram in May.  Continue to follow.  Disposition:  Follow up after echocardiogram in May 2026.  Signed, Jayson JUDITHANN Sierras, M.D., F.A.C.C. [facility]

## 2024-10-19 NOTE — Telephone Encounter (Signed)
 Checking percert on the following   LONG TERM MONITOR (3-14 DAYS)

## 2024-10-19 NOTE — Patient Instructions (Addendum)
 Medication Instructions:  Your physician recommends that you continue on your current medications as directed. Please refer to the Current Medication list given to you today.  Labwork: none  Testing/Procedures: Your physician has requested that you have an echocardiogram in May 2026. Echocardiography is a painless test that uses sound waves to create images of your heart. It provides your doctor with information about the size and shape of your heart and how well your heart's chambers and valves are working. This procedure takes approximately one hour. There are no restrictions for this procedure. Please do NOT wear cologne, perfume, aftershave, or lotions (deodorant is allowed). Please arrive 15 minutes prior to your appointment time.  Please note: We ask at that you not bring children with you during ultrasound (echo/ vascular) testing. Due to room size and safety concerns, children are not allowed in the ultrasound rooms during exams. Our front office staff cannot provide observation of children in our lobby area while testing is being conducted. An adult accompanying a patient to their appointment will only be allowed in the ultrasound room at the discretion of the ultrasound technician under special circumstances. We apologize for any inconvenience. 72 hour ZIO XT-see instructions inside pamphlet  Follow-Up: Your physician recommends that you schedule a follow-up appointment in: 6 months  Any Other Special Instructions Will Be Listed Below (If Applicable).  If you need a refill on your cardiac medications before your next appointment, please call your pharmacy.

## 2024-10-22 ENCOUNTER — Encounter: Payer: Self-pay | Admitting: Cardiology

## 2024-10-22 NOTE — Telephone Encounter (Signed)
 Error

## 2024-10-30 ENCOUNTER — Telehealth: Payer: Self-pay | Admitting: Physician Assistant

## 2024-10-30 NOTE — Telephone Encounter (Signed)
 Rosalee from [place] [phone]   ref# [id number]) paged after hour answering service to report the result of a 3 day non-live Zio monitor. 100% afib burden with one episode of slow afib with HR of 38 bpm lasting only 60 seconds on 11/18 @12 :31am.   Likely during sleep. Will await MD to review the full report.

## 2024-11-02 ENCOUNTER — Ambulatory Visit: Payer: Self-pay | Admitting: Cardiology

## 2024-11-02 DIAGNOSIS — I4891 Unspecified atrial fibrillation: Secondary | ICD-10-CM | POA: Diagnosis not present

## 2024-11-02 DIAGNOSIS — R001 Bradycardia, unspecified: Secondary | ICD-10-CM | POA: Diagnosis not present

## 2025-04-19 ENCOUNTER — Ambulatory Visit
